# Patient Record
Sex: Female | Born: 1965 | Race: White | Hispanic: No | Marital: Married | Smoking: Never smoker
Health system: Southern US, Community
[De-identification: ages and names within clinical notes are randomized; demographics above are authoritative.]

## PROBLEM LIST (undated history)

## (undated) HISTORY — PX: SPLENECTOMY: SUR1306

---

## 1999-11-25 ENCOUNTER — Encounter: Payer: Self-pay | Admitting: Emergency Medicine

## 1999-11-25 ENCOUNTER — Encounter: Payer: Self-pay | Admitting: *Deleted

## 1999-11-25 ENCOUNTER — Inpatient Hospital Stay (HOSPITAL_COMMUNITY): Admission: EM | Admit: 1999-11-25 | Discharge: 1999-11-26 | Payer: Self-pay | Admitting: Emergency Medicine

## 1999-11-25 ENCOUNTER — Encounter: Payer: Self-pay | Admitting: Orthopedic Surgery

## 2001-10-04 ENCOUNTER — Encounter (INDEPENDENT_AMBULATORY_CARE_PROVIDER_SITE_OTHER): Payer: Self-pay | Admitting: Specialist

## 2001-10-04 ENCOUNTER — Inpatient Hospital Stay (HOSPITAL_COMMUNITY): Admission: AC | Admit: 2001-10-04 | Discharge: 2001-10-11 | Payer: Self-pay

## 2001-10-04 ENCOUNTER — Encounter: Payer: Self-pay | Admitting: General Surgery

## 2001-10-04 ENCOUNTER — Encounter: Payer: Self-pay | Admitting: Emergency Medicine

## 2001-10-05 ENCOUNTER — Encounter: Payer: Self-pay | Admitting: General Surgery

## 2001-10-05 ENCOUNTER — Encounter: Payer: Self-pay | Admitting: *Deleted

## 2001-10-06 ENCOUNTER — Encounter: Payer: Self-pay | Admitting: Surgery

## 2001-10-07 ENCOUNTER — Encounter: Payer: Self-pay | Admitting: General Surgery

## 2001-10-08 ENCOUNTER — Encounter: Payer: Self-pay | Admitting: General Surgery

## 2003-04-25 ENCOUNTER — Inpatient Hospital Stay (HOSPITAL_COMMUNITY): Admission: EM | Admit: 2003-04-25 | Discharge: 2003-04-28 | Payer: Self-pay | Admitting: Emergency Medicine

## 2003-04-25 ENCOUNTER — Encounter: Payer: Self-pay | Admitting: Emergency Medicine

## 2003-04-26 ENCOUNTER — Encounter: Payer: Self-pay | Admitting: Family Medicine

## 2008-04-06 ENCOUNTER — Ambulatory Visit (HOSPITAL_COMMUNITY): Admission: EM | Admit: 2008-04-06 | Discharge: 2008-04-06 | Payer: Self-pay | Admitting: Emergency Medicine

## 2008-10-15 ENCOUNTER — Ambulatory Visit (HOSPITAL_COMMUNITY): Admission: RE | Admit: 2008-10-15 | Discharge: 2008-10-15 | Payer: Self-pay | Admitting: Family Medicine

## 2010-12-20 NOTE — Consult Note (Signed)
NAME:  [name], [name]           ACCOUNT NO.:  [id number]   MEDICAL RECORD NO.:  [id number]          PATIENT TYPE:  INP   LOCATION:  2550                         FACILITY:  [facility]   PHYSICIAN:  Artist Pais. 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  She is 42.  She has no known drug  allergies.  No current medications.  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 Dominica Severin several years back.  She is currently taking  no medications and again no recent hospitalizations or surgery.  She  does not smoke or drink.   Exam today is well-developed and well-nourished female, pleasant, alert  and oriented x3.  Examination of her little finger shows a near  amputation with an oblique laceration that starts dorsally to eponychial  fold and volarly to the level of the DIP flexion crease.  She has an  intact nail plate.  There appears to be a nailbed laceration and open  fracture, distal phalanx.  No wrist and hand contamination.  X-rays show  fracture of the proximal third distal phalanx.   A 45 year old female with an open injury to the distal phalangeal area  of her dominant left little finger.  At this point in time, we will take  her to the operating room for an I and D and operative fixation of his  open distal phalangeal fracture.      Artist Pais Mina Marble, M.D.  Electronically Signed     MAW/MEDQ  D:  04/06/2008  T:  04/07/2008  Job:  [id number]

## 2010-12-20 NOTE — Op Note (Signed)
NAME:  [name], [name]           ACCOUNT NO.:  [id number]   MEDICAL RECORD NO.:  [id number]          PATIENT TYPE:  INP   LOCATION:  2550                         FACILITY:  [facility]   PHYSICIAN:  Artist Pais. Weingold, M.D.DATE OF BIRTH:  [birth year]   DATE OF PROCEDURE:  04/06/2008  DATE OF DISCHARGE:                               OPERATIVE REPORT   PREOPERATIVE DIAGNOSIS:  Displaced open fracture, distal phalanx, left  fifth digit.   POSTOPERATIVE DIAGNOSIS:  Displaced open fracture, distal phalanx, left  fifth digit.   PROCEDURE:  Irrigation and debridement above with open reduction and  internal fixation with single 0.035 K-wire and nail bed repair.   SURGEON:  Artist Pais. Mina Marble, MD   ASSISTANT:  None.   ANESTHESIA:  General.   TOURNIQUET TIME:  39 minutes.   COMPLICATIONS:  None.   DRAINS:  None.   OPERATIVE REPORT:  The patient was taken to the operating suite.  After  induction of adequate general anesthesia, left upper extremity was  prepped and draped in a sterile fashion.  An Esmarch was used to  exsanguinate limb.  Tourniquet was then inflated to 250 mmHg.  At this  point in time, the open fracture was debrided.  The germinal matrix had  been avulsed underneath the eponychial fold.  It was carefully placed  below the eponychial fold and was repaired with 4-0 nylon.  The wound  was opened.  The fracture was debrided off clot and some nonviable  material ; closed reduction was performed.  This was followed by  placement of single 0.035 K-wire distal to proximal across the fracture  site and across the distal interphalangeal joint.  Intraoperative  fluoroscopy revealed adequate reduction on both the AP and lateral view.  The K-wire was cut outside the skin bent upon itself and this was  followed by skin closure with 4-0 nylon.  Xeroform, 4x4s, and a volar  splint were applied as well as Coban wrap.  The patient tolerated the  procedure well and went to recovery  room in stable fashion.      Artist Pais Mina Marble, M.D.  Electronically Signed     MAW/MEDQ  D:  04/06/2008  T:  04/07/2008  Job:  [id number]
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
## 2010-12-23 NOTE — H&P (Signed)
[place]. [facility]  Patient:    [name], [name]                 MRN: [id number] Adm. Date:  11/25/99 Attending:  Elisha Ponder, M.D. Dictator:   Druscilla Brownie. Shela Nevin, P.A.                         History and Physical  CHIEF COMPLAINT:  "Pain in my long finger of my left hand."  HISTORY OF PRESENT ILLNESS:  This 45 year old white female who was at her place of employment at the [employer] in [place], [place], was feeding material into a grommet machine in order to affix handles to the material and then to be applied to the mattress.  Apparently the guard that is supposed to be engaged at the time of the grommet application was not working.  As she was feeding the  grommets into the compression machine her left long finger was smashed by the machine itself.  She had a small previous scratch while at work to her ring finger and had applied a Band-Aid early in the day.  She had immediate pain into the long finger.  Was brought to [facility] Emergency Room for evaluation.  Examination:  The patient is at bedside.  She has a open laceration at the volar surface of the DIP area of the left long finger.  Sensation is intact to the fingertip.  She has good capillary refill to the fingertip and she can flex it t the DIP joint.  X-rays taken show a fracture of the distal end of the middle phalanx at the DIP joint.  After discussion with Dr. Amanda Pea, it was felt that this patient would need debridement, as well as I&D and pinning of the fracture.  The patient is admitted for that procedure, observation, IV antibiotic therapy.  The patient received tetanus toxoid in the emergency room and a gm of cefazolin.  She is accompanied by her husband today.  PAST MEDICAL HISTORY:  This lady has really been in good health throughout her entire lifetime.  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   She has had tubal ligation.  She denies any medical illnesses.  MEDICATIONS:  Takes no medicines at the present time.  ALLERGIES:  Has no medical allergies.  The last time she had anything to eat was at 5:30 this morning.  SOCIAL HISTORY:  She neither smokes nor drinks.  Is married.  FAMILY HISTORY:  Noncontributory.  REVIEW OF SYSTEMS:  CNS:  No seizure disorder, paralysis, numbness, or double vision.  Respiratory:  No productive cough.  No hemoptysis.  No shortness of breath.  Cardiovascular:  No chest pain.  No angina.  No orthopnea. Gastrointestinal:  No nausea, vomiting, melena, bloody stool.  Genitourinary: o discharge, dysuria, or hematuria.  Musculoskeletal:  Primarily in present illness with her left long finger.  PHYSICAL EXAMINATION:  GENERAL:  Alert, cooperative, friendly, 45 year old, white female who is accompanied by her husband.  VITAL SIGNS:  Vital signs are on emergency room record.  HEENT:  Normocephalic.  PERRLA.  Oropharynx is clear.  CHEST:  Clear to auscultation.  No rhonchi.  No rales.  HEART:  Regular rate and rhythm.  No murmurs are heard.  ABDOMEN:  Soft and nontender.  GENITALIA, RECTAL, PELVIC, BREASTS:  Not done.  Not pertinent to present illness.  EXTREMITIES:  Left long finger as in present illness above.  ADMITTING DIAGNOSIS:  Open fracture of distal middle phalanx, left long finger t the DIP joint.  PLAN:  The patient will be admitted for I&D, debridement, pinning of the fracture and repair of laceration and probable extensor tendon in the left long finger.  Of note, the patient had an old fracture of the tuft of the left small finger. It was not injured during this present illness. DD:  11/25/99 TD:  11/25/99 Job: [id number]

## 2010-12-23 NOTE — Discharge Summary (Signed)
   NAME:  [name], [name]                     ACCOUNT NO.:  [id number]   MEDICAL RECORD NO.:  [id number]                   PATIENT TYPE:  INP   LOCATION:  A326                                 FACILITY:  [facility]   PHYSICIAN:  Kirk Ruths, M.D.            DATE OF BIRTH:  1965/12/05   DATE OF ADMISSION:  04/25/2003  DATE OF DISCHARGE:  04/28/2003                                 DISCHARGE SUMMARY   DISCHARGE DIAGNOSIS:  Acute pyelonephritis.   HOSPITAL COURSE:  This is a 45 year old white female who is admitted through  the emergency room with left flank pain with a temperature of 103 with many  white cells in her urine.  The patient was begun on IV rocephin.  Cultures  were obtained.  Blood cultures were negative.  Urine culture grew out 50,000  colonies of E. coli.  The patient's initial white count was 21,000 on  admission with many white cells in her urine.  Electrolytes were normal,  except sodium was slightly low at 133.  BUN and creatinine were 9 and 0.6.  Pregnancy test was negative.   The patient underwent a CT scan of the abdomen to rule out abscess; and CT  showed no evidence of abscess or hydronephrosis.  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 She is stable,  afebrile for 24 hours and will be discharged home on p.o. Keflex to be  followed in the office in 1 week.  She is stable at the time of discharge.     ___________________________________________                                         Kirk Ruths, M.D.   WMM/MEDQ  D:  04/28/2003  T:  04/28/2003  Job:  [id number]

## 2010-12-23 NOTE — Op Note (Signed)
[place]. [facility]  Patient:    [name], [name]                    MRN: [id number] Proc. Date: 11/25/99 Attending:  Elisha Ponder, M.D.                           Operative Report  DATE OF BIRTH:  07-29-1966.  PREOPERATIVE DIAGNOSIS:  Open left middle finger P-2 fracture with extensive tendon lacerations and dirty contamination with near amputation about the finger and circumferential lacerations about the finger.  POSTOPERATIVE DIAGNOSIS:  Open left middle finger P-2 fracture with extensive tendon lacerations and dirty contamination with near amputation about the finger and circumferential lacerations about the finger.  PROCEDURE: 1. Irrigation and debridement, left middle finger, to include skin,    subcutaneous tissue, and bone, as well as tendon and structures. 2. Explore ______ noted to be intact with volar plate laceration. 3. Open reduction internal fixation, left middle finger, P-2 (middle phalanx)    fracture. 4. Extensive tendon repair, left middle finger at the P-2 level. 5. Volar skin laceration repair.  SURGEON:  Elisha Ponder, M.D.  ASSISTANT:  Alexzandrew L. Perkins, P.A.-C.  COMPLICATIONS:  None.  ANESTHESIA:  General.  TOURNIQUET TIME:  Zero.  SPECIMENS:  None.  ESTIMATED BLOOD LOSS:  Less than 50 cc.  INDICATIONS FOR PROCEDURE:  Ms. Rambeau is a 45 year old white female who sustained a severe crushing injury to her left middle finger while at work. The patient works for [employer].  She was handfeeding materials into a grommet machine when the garment device caught on her left middle finger.  The patient was transferred to the hospital and noted to have the above-mentioned injuries.  Specifically, she has an open PIP joint with fracture at the distal edge of the middle phalanx.  This is open.  She has an extensive tendon laceration.  She does have refill to the tip of the finger and some pinprick sensation to  a 25-gauge needle.  This is rather surprising given the circumferential laceration about the finger and severe crush.  The patient does have comminution with bony fragments noted.  She is in a great deal of pain, and her flexor competency is difficult to evaluate.  She certainly has a near amputation.  I have discussed the risks and benefits of surgery.  She was seen by myself and my physicians assistant, Cherly Beach, in regards to this.  The patient was counseled for surgery and its risks and benefits including risk of infection, bleeding, anesthesia, damage to normal structures, ______ infection.  I discussed with her stiffness, risk for infection, specifically osteomyelitis, malunion, nonunion, delayed union, and need for PIP fusion in the future.  With all this in mind, she decided to proceed.  DESCRIPTION OF PROCEDURE:  The patient was seen in preoperative holding and counseled.  Following this, she was taken to the operating suite and underwent a smooth induction of general anesthesia and was appropriately padded in the supine position.  Next, she was prepped and draped in the usual sterile fashion.  Care was taken during the prep to avoid injury to the near-amputated fingertip about the left middle finger.  Once a prep and drape had been accomplished, the patient then underwent efforts at I&D of the finger.  The finger underwent an excisional debridement including the skin, subcutaneous tissue, and bone.  The patient had comminuted bony  fragments.  They were removed sharply with a knife blade under ______ magnification.  The dorsal extensor tendon was lacerated.  There was soft tissue damage with dirty contamination.  I did use a curette to remove any dirty debris.  All areas were I&Dd including the circumferential nature of the laceration.  Copious amounts of warm irrigant were placed.  The tourniquet was not inflated.  The patient maintained capillary refill to the tip of her  finger.  In essence, the fingertip could be shotgunned open from a dorsal approach to allow visualization of the open PIP joint with significant comminution about the fracture fragments.  The extensor tendon was noted as well.  The flexor tendon at this time was identified and noted to be intact.  The volar plate was lacerated.  With this noted and the I&D completed, the patient then had drapes changed and following this underwent ORIF of the finger.  This was done by shotgunning the finger open, once again being very careful to avoid injury to the volar structures which demonstrated intact vascular integrity.  The patient then had a 0.035 K wire placed from the PIP joint region distally to exit at the tip of the finger.  This harnessed the P-2 fracture.  The P-II fracture was harnessed with a "Y".  The "Y" was placed across the PIP joint in a reduced position about the fragment, and the "Y" was then allowed to exit distally.  It was then inset into the fracture site at P-2, and reduction was held while assistant drove the K wire across the fracture site.  This was done under radiographic image.  Two K wires were used and had excellent fixation. I was very pleased with the overall reduction.  Even despite the significant comminution, the patient maintained a fairly reasonable reduction, in my opinion.  The pins engaged nicely and were bent upon themselves ______ . Following this, more irrigation was applied.  Capillary refill was noted to be excellent, and the patient then underwent repair of the extensor tendon with Mersilene suture of the 4-0 variety without difficulty.  The extensor tendon approximated nicely.  Following this, more irrigation was applied followed by closure of the wound very loosely with interrupted Prolene and chromic suture, taking care not to overly tighten the skin edge to allow for any egress of fluid.  Excellent refill was noted at this time.  Adaptic was placed,  followed by fluffed gauze, Kerlix Webril, and finger splint as well as a volar plaster splint.  The patient was awakened from general anesthesia after this and  transferred to the recovery room in stable condition.  All sponge, needle, and instrument counts were reported as correct.  Excellent refill was noted in the recovery room.  The patient was stable, and we monitored closely.  She will be on IV antibiotics, IV fluids.  She will receive a course of Dextran and be monitored closely.  I have discussed with the family all issues.  It was a pleasure to participate in her care, and I look forward to participating in her postoperative recovery. DD:  11/25/99 TD:  11/27/99 Job: [id number]
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
## 2018-03-18 ENCOUNTER — Ambulatory Visit: Payer: Self-pay | Admitting: Physician Assistant

## 2018-04-04 ENCOUNTER — Other Ambulatory Visit: Payer: Self-pay

## 2018-04-04 ENCOUNTER — Encounter: Payer: Self-pay | Admitting: Physician Assistant

## 2018-04-04 ENCOUNTER — Ambulatory Visit (INDEPENDENT_AMBULATORY_CARE_PROVIDER_SITE_OTHER): Payer: PRIVATE HEALTH INSURANCE | Admitting: Physician Assistant

## 2018-04-04 VITALS — BP 110/86 | HR 58 | Temp 97.8°F | Resp 14 | Ht 65.0 in | Wt 118.0 lb

## 2018-04-04 DIAGNOSIS — J988 Other specified respiratory disorders: Secondary | ICD-10-CM | POA: Diagnosis not present

## 2018-04-04 DIAGNOSIS — B9689 Other specified bacterial agents as the cause of diseases classified elsewhere: Secondary | ICD-10-CM

## 2018-04-04 DIAGNOSIS — R05 Cough: Secondary | ICD-10-CM

## 2018-04-04 DIAGNOSIS — Z8781 Personal history of (healed) traumatic fracture: Secondary | ICD-10-CM

## 2018-04-04 DIAGNOSIS — Z8709 Personal history of other diseases of the respiratory system: Secondary | ICD-10-CM | POA: Diagnosis not present

## 2018-04-04 DIAGNOSIS — R0602 Shortness of breath: Secondary | ICD-10-CM | POA: Diagnosis not present

## 2018-04-04 DIAGNOSIS — Z0000 Encounter for general adult medical examination without abnormal findings: Secondary | ICD-10-CM

## 2018-04-04 DIAGNOSIS — R059 Cough, unspecified: Secondary | ICD-10-CM | POA: Insufficient documentation

## 2018-04-04 DIAGNOSIS — Z9081 Acquired absence of spleen: Secondary | ICD-10-CM

## 2018-04-04 DIAGNOSIS — Z87828 Personal history of other (healed) physical injury and trauma: Secondary | ICD-10-CM

## 2018-04-04 MED ORDER — AZITHROMYCIN 250 MG PO TABS: ORAL_TABLET | ORAL | 0 refills | Status: DC
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
## 2018-04-11 ENCOUNTER — Other Ambulatory Visit: Payer: Self-pay | Admitting: Physician Assistant

## 2018-04-11 ENCOUNTER — Ambulatory Visit (INDEPENDENT_AMBULATORY_CARE_PROVIDER_SITE_OTHER): Payer: PRIVATE HEALTH INSURANCE | Admitting: Physician Assistant

## 2018-04-11 ENCOUNTER — Encounter: Payer: Self-pay | Admitting: Physician Assistant

## 2018-04-11 ENCOUNTER — Ambulatory Visit
Admission: RE | Admit: 2018-04-11 | Discharge: 2018-04-11 | Disposition: A | Discharge status: Home / Self Care | Payer: PRIVATE HEALTH INSURANCE | Source: Ambulatory Visit | Attending: Physician Assistant | Admitting: Physician Assistant

## 2018-04-11 VITALS — BP 118/82 | HR 55 | Temp 97.8°F | Resp 16 | Ht 65.0 in | Wt 118.0 lb

## 2018-04-11 DIAGNOSIS — Z9081 Acquired absence of spleen: Secondary | ICD-10-CM

## 2018-04-11 DIAGNOSIS — R05 Cough: Secondary | ICD-10-CM

## 2018-04-11 DIAGNOSIS — Z0000 Encounter for general adult medical examination without abnormal findings: Secondary | ICD-10-CM | POA: Diagnosis not present

## 2018-04-11 DIAGNOSIS — R0602 Shortness of breath: Secondary | ICD-10-CM

## 2018-04-11 DIAGNOSIS — R059 Cough, unspecified: Secondary | ICD-10-CM

## 2018-04-11 DIAGNOSIS — Z1231 Encounter for screening mammogram for malignant neoplasm of breast: Secondary | ICD-10-CM

## 2018-04-11 LAB — COMPLETE METABOLIC PANEL WITH GFR
AG Ratio: 1.7 (calc) (ref 1.0–2.5)
ALBUMIN MSPROF: 4.8 g/dL (ref 3.6–5.1)
ALT: 11 U/L (ref 6–29)
AST: 17 U/L (ref 10–35)
Alkaline phosphatase (APISO): 61 U/L (ref 33–130)
BILIRUBIN TOTAL: 0.6 mg/dL (ref 0.2–1.2)
BUN: 16 mg/dL (ref 7–25)
CALCIUM: 9.9 mg/dL (ref 8.6–10.4)
CHLORIDE: 103 mmol/L (ref 98–110)
CO2: 28 mmol/L (ref 20–32)
Creat: 0.8 mg/dL (ref 0.50–1.05)
GFR, EST AFRICAN AMERICAN: 97 mL/min/{1.73_m2} (ref >=60)
GFR, EST NON AFRICAN AMERICAN: 84 mL/min/{1.73_m2} (ref >=60)
GLUCOSE: 87 mg/dL (ref 65–99)
Globulin: 2.8 g/dL (calc) (ref 1.9–3.7)
Potassium: 4.5 mmol/L (ref 3.5–5.3)
Sodium: 139 mmol/L (ref 135–146)
TOTAL PROTEIN: 7.6 g/dL (ref 6.1–8.1)

## 2018-04-11 LAB — CBC WITH DIFFERENTIAL/PLATELET
BASOS PCT: 0.7 %
Basophils Absolute: 49 cells/uL (ref 0–200)
EOS ABS: 91 {cells}/uL (ref 15–500)
EOS PCT: 1.3 %
HCT: 42.8 % (ref 35.0–45.0)
HEMOGLOBIN: 14.5 g/dL (ref 11.7–15.5)
Lymphs Abs: 3542 cells/uL (ref 850–3900)
MCH: 31.5 pg (ref 27.0–33.0)
MCHC: 33.9 g/dL (ref 32.0–36.0)
MCV: 93 fL (ref 80.0–100.0)
MONOS PCT: 14.7 %
MPV: 10.8 fL (ref 7.5–12.5)
NEUTROS ABS: 2289 {cells}/uL (ref 1500–7800)
Neutrophils Relative %: 32.7 %
PLATELETS: 363 10*3/uL (ref 140–400)
RBC: 4.6 10*6/uL (ref 3.80–5.10)
RDW: 12.5 % (ref 11.0–15.0)
TOTAL LYMPHOCYTE: 50.6 %
WBC mixed population: 1029 cells/uL — ABNORMAL HIGH (ref 200–950)
WBC: 7 10*3/uL (ref 3.8–10.8)

## 2018-04-11 LAB — LIPID PANEL
Cholesterol: 192 mg/dL (ref <200)
HDL: 64 mg/dL (ref >50)
LDL CHOLESTEROL (CALC): 112 mg/dL — AB
Non-HDL Cholesterol (Calc): 128 mg/dL (calc) (ref <130)
Total CHOL/HDL Ratio: 3 (calc) (ref <5.0)
Triglycerides: 71 mg/dL (ref <150)

## 2018-04-11 LAB — TSH: TSH: 2.57 m[IU]/L
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
## 2018-04-12 ENCOUNTER — Other Ambulatory Visit: Payer: Self-pay

## 2018-04-12 LAB — PAP, TP IMAGING W/ HPV RNA, RFLX HPV TYPE 16,18/45: HPV DNA HIGH RISK: NOT DETECTED

## 2018-04-12 MED ORDER — CETIRIZINE HCL 10 MG PO TABS: 10.0000 mg | ORAL_TABLET | Freq: Every day | ORAL | 5 refills | Status: DC

## 2018-04-22 ENCOUNTER — Telehealth: Payer: Self-pay | Admitting: Physician Assistant

## 2018-04-22 NOTE — Telephone Encounter (Signed)
Patient declined referral to gastroenterology for screening colonoscopy. Please see referral from 04/11/18

## 2018-05-07 ENCOUNTER — Ambulatory Visit
Admission: RE | Admit: 2018-05-07 | Discharge: 2018-05-07 | Disposition: A | Discharge status: Home / Self Care | Payer: PRIVATE HEALTH INSURANCE | Source: Ambulatory Visit | Attending: Physician Assistant | Admitting: Physician Assistant

## 2018-05-07 DIAGNOSIS — Z1231 Encounter for screening mammogram for malignant neoplasm of breast: Secondary | ICD-10-CM

## 2019-03-16 IMAGING — MG DIGITAL SCREENING BILATERAL MAMMOGRAM WITH CAD
4 series · 4 of 4 positions shown · non-contrast
Comparison: Previous exam(s).

CLINICAL DATA: Screening.

EXAM:
DIGITAL SCREENING BILATERAL MAMMOGRAM WITH CAD

[R MLO]
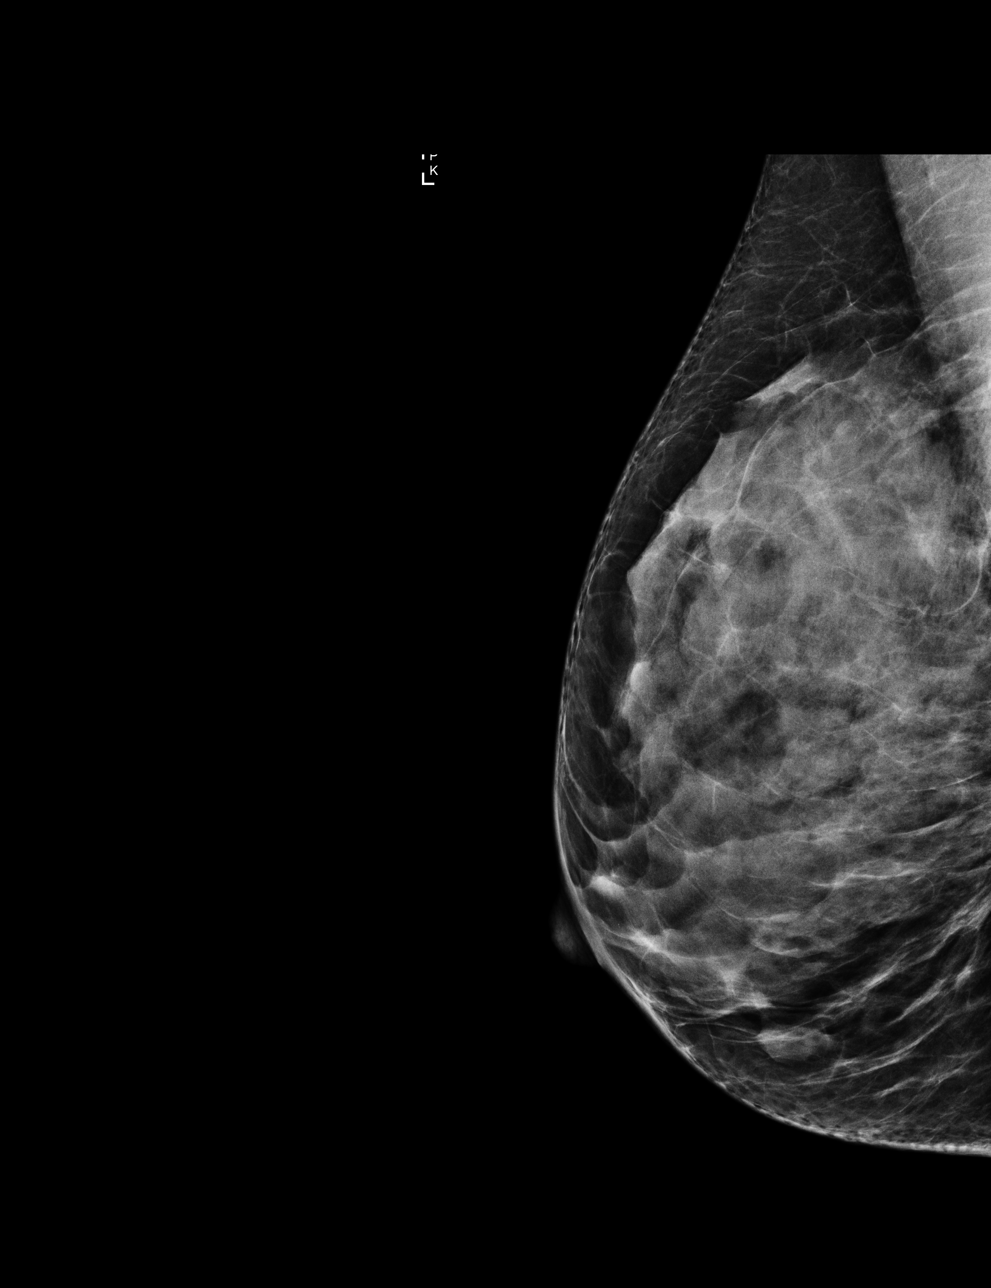

[L MLO]
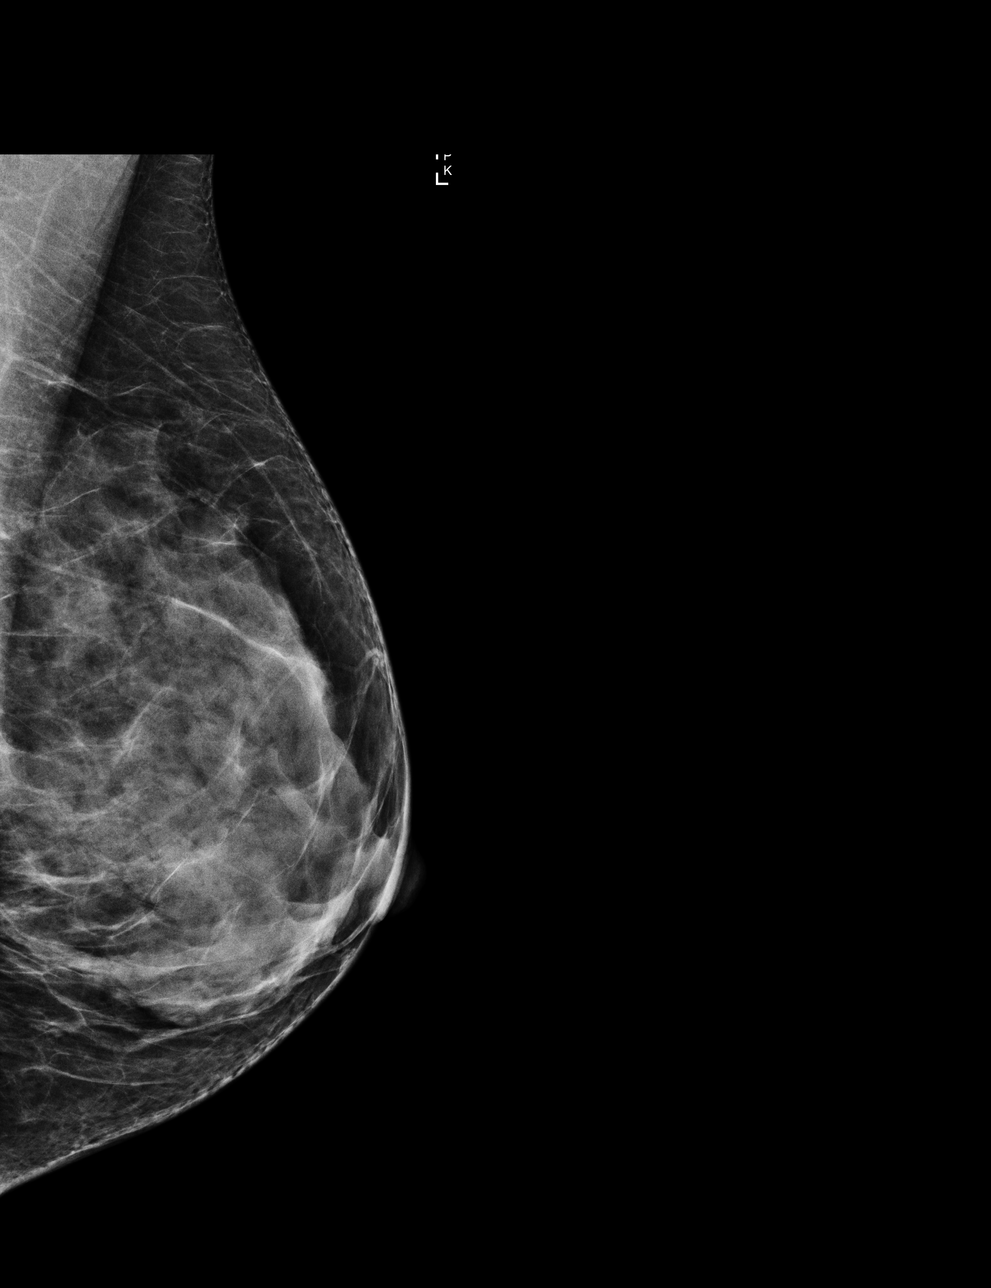

[R CC]
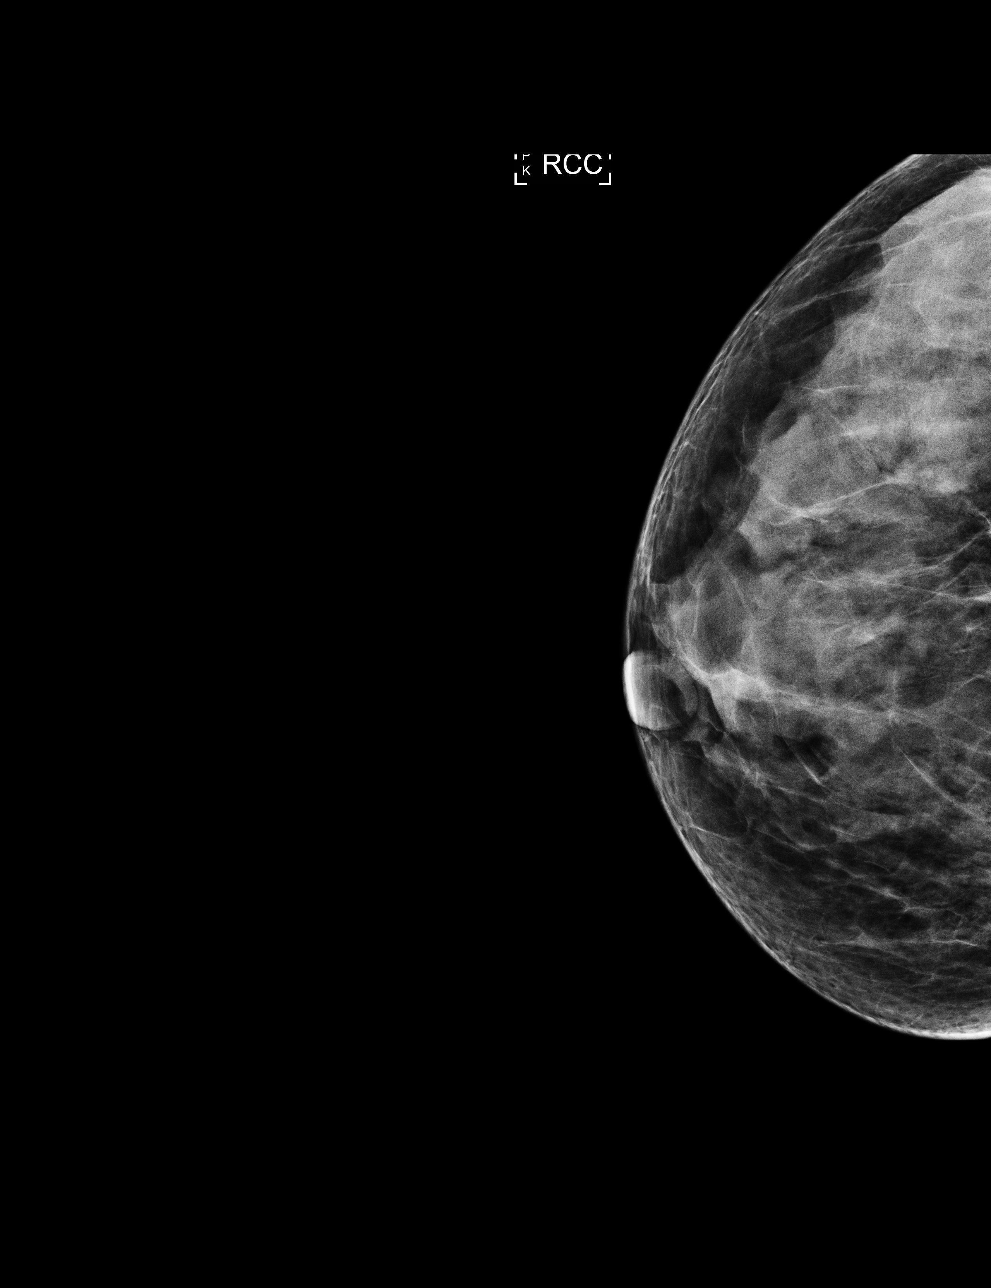

[L CC]
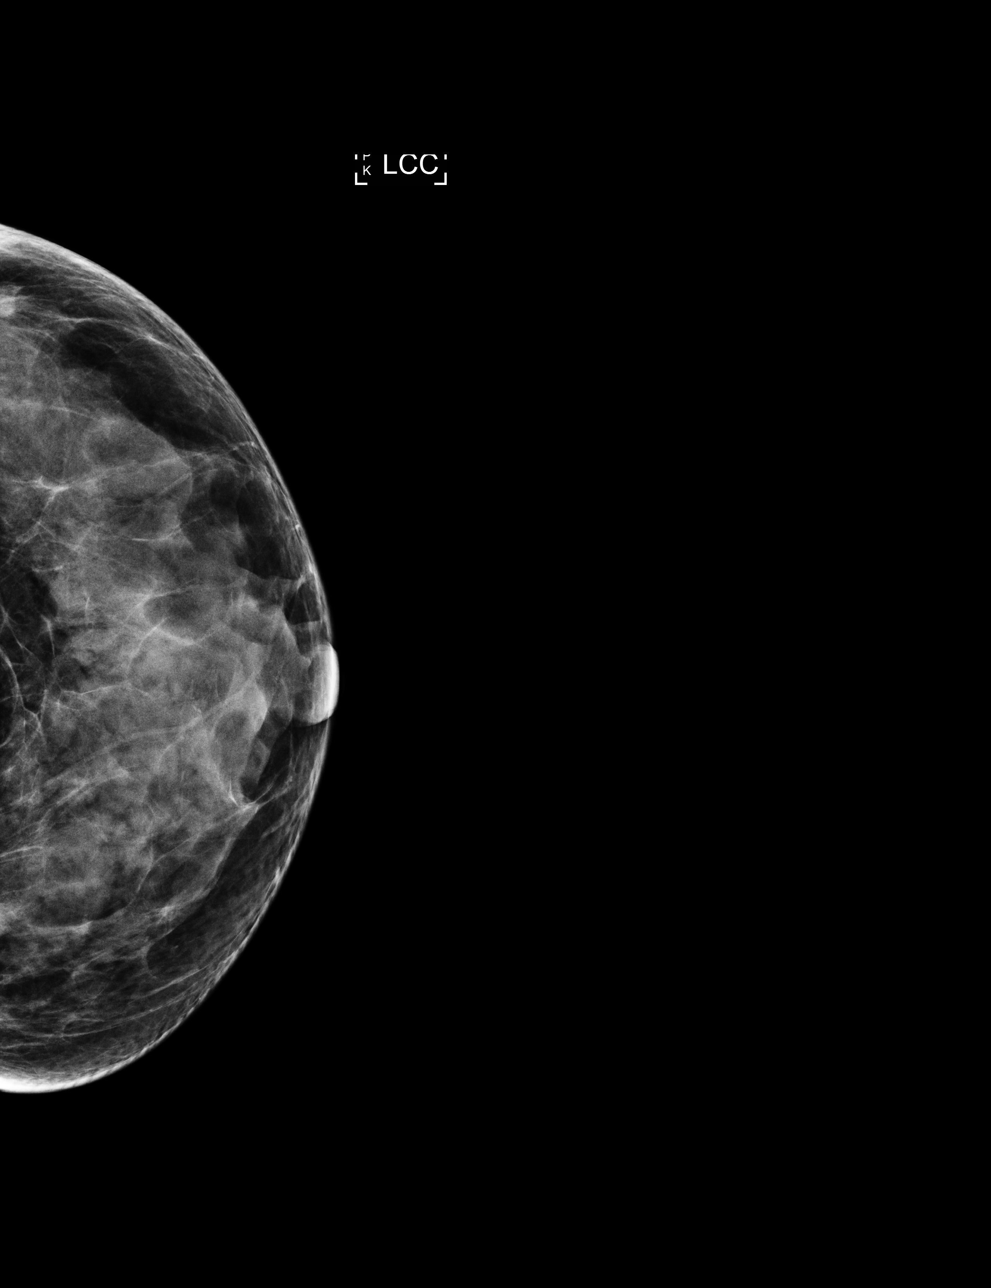

[4 of 4 positions shown; findings below may reference images not displayed]

ACR Breast Density Category d: The breast tissue is extremely dense,
which lowers the sensitivity of mammography.
FINDINGS: There are no findings suspicious for malignancy. Images were
processed with CAD.
IMPRESSION: No mammographic evidence of malignancy. A result letter of this
screening mammogram will be mailed directly to the patient.

RECOMMENDATION:
Screening mammogram in one year. (Code:BD-D-K0F)

BI-RADS CATEGORY  1: Negative.

## 2022-05-18 ENCOUNTER — Other Ambulatory Visit: Payer: Self-pay

## 2022-05-18 ENCOUNTER — Emergency Department (HOSPITAL_COMMUNITY): Payer: No Typology Code available for payment source

## 2022-05-18 ENCOUNTER — Emergency Department (HOSPITAL_COMMUNITY): Payer: PRIVATE HEALTH INSURANCE

## 2022-05-18 ENCOUNTER — Inpatient Hospital Stay (HOSPITAL_COMMUNITY)
Admission: EM | Admit: 2022-05-18 | Discharge: 2022-05-21 | DRG: 480 | Disposition: A | Discharge status: Home Health | Payer: No Typology Code available for payment source | Attending: Family Medicine | Admitting: Family Medicine

## 2022-05-18 DIAGNOSIS — R0609 Other forms of dyspnea: Secondary | ICD-10-CM | POA: Diagnosis not present

## 2022-05-18 DIAGNOSIS — Z9081 Acquired absence of spleen: Secondary | ICD-10-CM | POA: Diagnosis not present

## 2022-05-18 DIAGNOSIS — S72031A Displaced midcervical fracture of right femur, initial encounter for closed fracture: Principal | ICD-10-CM | POA: Diagnosis present

## 2022-05-18 DIAGNOSIS — S72001A Fracture of unspecified part of neck of right femur, initial encounter for closed fracture: Secondary | ICD-10-CM | POA: Diagnosis present

## 2022-05-18 DIAGNOSIS — J9601 Acute respiratory failure with hypoxia: Secondary | ICD-10-CM | POA: Diagnosis not present

## 2022-05-18 DIAGNOSIS — Z681 Body mass index (BMI) 19 or less, adult: Secondary | ICD-10-CM | POA: Diagnosis not present

## 2022-05-18 DIAGNOSIS — Z8616 Personal history of COVID-19: Secondary | ICD-10-CM | POA: Diagnosis not present

## 2022-05-18 DIAGNOSIS — W1839XA Other fall on same level, initial encounter: Secondary | ICD-10-CM | POA: Diagnosis present

## 2022-05-18 DIAGNOSIS — R1310 Dysphagia, unspecified: Secondary | ICD-10-CM | POA: Diagnosis present

## 2022-05-18 DIAGNOSIS — R918 Other nonspecific abnormal finding of lung field: Secondary | ICD-10-CM

## 2022-05-18 DIAGNOSIS — J849 Interstitial pulmonary disease, unspecified: Secondary | ICD-10-CM | POA: Diagnosis not present

## 2022-05-18 DIAGNOSIS — J9621 Acute and chronic respiratory failure with hypoxia: Secondary | ICD-10-CM

## 2022-05-18 DIAGNOSIS — R9389 Abnormal findings on diagnostic imaging of other specified body structures: Secondary | ICD-10-CM | POA: Diagnosis not present

## 2022-05-18 DIAGNOSIS — R64 Cachexia: Secondary | ICD-10-CM | POA: Diagnosis present

## 2022-05-18 DIAGNOSIS — Z20822 Contact with and (suspected) exposure to covid-19: Secondary | ICD-10-CM | POA: Diagnosis present

## 2022-05-18 LAB — CBC WITH DIFFERENTIAL/PLATELET
Abs Immature Granulocytes: 0.05 10*3/uL (ref 0.00–0.07)
Basophils Absolute: 0 10*3/uL (ref 0.0–0.1)
Basophils Relative: 0 %
Eosinophils Absolute: 0.1 10*3/uL (ref 0.0–0.5)
Eosinophils Relative: 1 %
HCT: 45 % (ref 36.0–46.0)
Hemoglobin: 15.1 g/dL — ABNORMAL HIGH (ref 12.0–15.0)
Immature Granulocytes: 0 %
Lymphocytes Relative: 18 %
Lymphs Abs: 2.3 10*3/uL (ref 0.7–4.0)
MCH: 32.7 pg (ref 26.0–34.0)
MCHC: 33.6 g/dL (ref 30.0–36.0)
MCV: 97.4 fL (ref 80.0–100.0)
Monocytes Absolute: 1 10*3/uL (ref 0.1–1.0)
Monocytes Relative: 7 %
Neutro Abs: 9.7 10*3/uL — ABNORMAL HIGH (ref 1.7–7.7)
Neutrophils Relative %: 74 %
Platelets: 320 10*3/uL (ref 150–400)
RBC: 4.62 MIL/uL (ref 3.87–5.11)
RDW: 13.2 % (ref 11.5–15.5)
WBC: 13.1 10*3/uL — ABNORMAL HIGH (ref 4.0–10.5)
nRBC: 0 % (ref 0.0–0.2)

## 2022-05-18 LAB — BASIC METABOLIC PANEL
Anion gap: 6 (ref 5–15)
BUN: 13 mg/dL (ref 6–20)
CO2: 25 mmol/L (ref 22–32)
Calcium: 9.3 mg/dL (ref 8.9–10.3)
Chloride: 107 mmol/L (ref 98–111)
Creatinine, Ser: 0.65 mg/dL (ref 0.44–1.00)
GFR, Estimated: >60 mL/min (ref >60)
Glucose, Bld: 98 mg/dL (ref 70–99)
Potassium: 4.4 mmol/L (ref 3.5–5.1)
Sodium: 138 mmol/L (ref 135–145)

## 2022-05-18 LAB — RESP PANEL BY RT-PCR (FLU A&B, COVID) ARPGX2
Influenza A by PCR: NEGATIVE
Influenza B by PCR: NEGATIVE
SARS Coronavirus 2 by RT PCR: NEGATIVE

## 2022-05-18 MED ORDER — HYDROMORPHONE HCL 1 MG/ML IJ SOLN
0.5000 mg | Freq: Once | INTRAMUSCULAR | Status: AC
  Administered 2022-05-18: 0.5 mg via INTRAVENOUS
  Filled 2022-05-18: qty 1

## 2022-05-18 MED ORDER — IOHEXOL 350 MG/ML SOLN
80.0000 mL | Freq: Once | INTRAVENOUS | Status: AC | PRN
  Administered 2022-05-18: 80 mL via INTRAVENOUS
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
## 2022-05-18 NOTE — Assessment & Plan Note (Signed)
Suspect patient may have had longstanding hypoxia based on symptoms elevated hemoglobin.  CT scan worrisome for diffuse alveolar disease.  Sent message to pulmonology to see patient in AM.  Ordered sed rate CRP AFB ANA Airborne precautions given cough weight loss night sweats Evidence of scarring versus mass.  Would need father pulmonology evaluation Patient is non-smoker never has history or COPD asthma   this patient has acute respiratory failure with Hypoxia and  as documented by the presence of following: O2 saturatio< 90% on RA  Likely due to: Underlying lung disease Provide O2 therapy and titrate as needed  Continuous pulse ox  check Pulse ox with ambulation prior to discharge  may need  TC consult for home O2 set up

## 2022-05-18 NOTE — Assessment & Plan Note (Signed)
-   management as per orthopedics,  plan to operate   in  a.m.    Keep nothing by mouth post midnight. Patient  not on anticoagulation or antiplatelet agents    Ordered type and screen order a vitamin D level  Patient at baseline able to walk a flight of stairs or 100 feet but slow and with  shortness of breath.    Patient denies any chest pain but has  shortness of breath  with exertion,   ECG showing no evidence of acute ischemia but is abnormal  no known history of coronary artery disease,  COPD   Liver failure   CKD  Given advanced age patient is at least moderate  risk   which has been discussed with family   will order echo  cycle CE    given severe Pulmonary disease would defere to pulmonology for pre-op clearance

## 2022-05-18 NOTE — Assessment & Plan Note (Signed)
Patient may be at risk for atypical infections.  Continue to monitor

## 2022-05-18 NOTE — ED Provider Notes (Signed)
[facility] EMERGENCY DEPARTMENT Provider Note   CSN: [id number] Arrival date & time: 05/18/22  1020     History  Chief Complaint  Patient presents with   Hip Pain    [name] is a 56 y.o. female with no significant history. Pt complains of  hip pain.  She states around 8:30 AM she hit a plastic bin, fell to her right on concrete.  Denies hitting her head.  Denies LOC.  She was able to get up and walk back to her house  immediately after the fall.  The pain started from her R hip and radiates to her groin.  He is now unable to lift her right leg.  Denies nausea, vomiting, chest pain, pain elsewhere.   Hip Pain       Home Medications Prior to Admission medications   Medication Sig Start Date End Date Taking? Authorizing Provider  cetirizine (ZYRTEC) 10 MG tablet Take 1 tablet (10 mg total) by mouth daily. 04/12/18   Dorena Bodo, PA-C      Allergies    Patient has no known allergies.    Review of Systems   Review of Systems  Musculoskeletal:        Hip pain     Physical Exam Updated Vital Signs BP 138/82   Pulse 74   Temp 98.1 F (36.7 C)   Resp 18   Ht 5\' 5"  (1.651 m)   Wt 43.5 kg   SpO2 92%   BMI 15.98 kg/m  Physical Exam Vitals and nursing note reviewed.  Constitutional:      Appearance: Normal appearance.  HENT:     Head: Normocephalic and atraumatic.     Mouth/Throat:     Mouth: Mucous membranes are moist.  Eyes:     General: No scleral icterus. Cardiovascular:     Rate and Rhythm: Normal rate and regular rhythm.     Pulses: Normal pulses.     Heart sounds: Normal heart sounds.  Pulmonary:     Effort: Pulmonary effort is normal.     Breath sounds: Normal breath sounds.  Abdominal:     General: Abdomen is flat.     Palpations: Abdomen is soft.     Tenderness: There is no abdominal tenderness.  Musculoskeletal:        General: No deformity.     Comments: TTP to right hip.  Skin:    General: Skin is warm.      Findings: No rash.  Neurological:     General: No focal deficit present.     Mental Status: She is alert.  Psychiatric:        Mood and Affect: Mood normal.     ED Results / Procedures / Treatments   Labs (all labs ordered are listed, but only abnormal results are displayed) Labs Reviewed  CBC WITH DIFFERENTIAL/PLATELET - Abnormal; Notable for the following components:      Result Value   WBC 13.1 (*)    Hemoglobin 15.1 (*)    Neutro Abs 9.7 (*)    All other components within normal limits  RESP PANEL BY RT-PCR (FLU A&B, COVID) ARPGX2  BASIC METABOLIC PANEL    EKG None  Radiology CT Angio Chest PE W and/or Wo Contrast  Result Date: 05/18/2022 CLINICAL DATA:  Pulmonary embolism (PE) suspected, high prob. Hypoxia EXAM: CT ANGIOGRAPHY CHEST WITH CONTRAST TECHNIQUE: Multidetector CT imaging of the chest was performed using the standard protocol during bolus administration of intravenous  contrast. Multiplanar CT image reconstructions and MIPs were obtained to evaluate the vascular anatomy. RADIATION DOSE REDUCTION: This exam was performed according to the departmental dose-optimization program which includes automated exposure control, adjustment of the mA and/or kV according to patient size and/or use of iterative reconstruction technique. CONTRAST:  36mL OMNIPAQUE IOHEXOL 350 MG/ML SOLN COMPARISON:  Chest x-ray 05/18/2022 FINDINGS: Cardiovascular: Satisfactory opacification of the pulmonary arteries to the segmental level. No evidence of pulmonary embolism. Normal heart size. No significant pericardial effusion. The thoracic aorta is normal in caliber. No atherosclerotic plaque of the thoracic aorta. No coronary artery calcifications. Mediastinum/Nodes: No enlarged mediastinal, hilar, or axillary lymph nodes. Thyroid gland, trachea, and esophagus demonstrate no significant findings. Lungs/Pleura: Mosaic attenuation of the of lungs. No focal consolidation. Scattered pulmonary nodules most  prominent within the lower lung zones with as an example a 1 cm right lower lobe pulmonary nodule (8:107) and a 0.7 cm lingular pulmonary nodule (8: 106). No pulmonary mass. No pleural effusion. No pneumothorax. Upper Abdomen: No acute abnormality. Musculoskeletal: No chest wall abnormality. No suspicious lytic or blastic osseous lesions. No acute displaced fracture. Review of the MIP images confirms the above findings. IMPRESSION: 1. No pulmonary embolus. 2. Scattered pulmonary nodules most prominent within the lower lung zones concerning for metastases. 3. Mosaic attenuation of the lungs. Findings suggestive of small airway disease. Electronically Signed   By: Tish Frederickson M.D.   On: 05/18/2022 18:54   CT HIP RIGHT WO CONTRAST  Result Date: 05/18/2022 CLINICAL DATA:  Right hip fracture, preop planning EXAM: CT OF THE RIGHT HIP WITHOUT CONTRAST TECHNIQUE: Multidetector CT imaging of the right hip was performed according to the standard protocol. Multiplanar CT image reconstructions were also generated. RADIATION DOSE REDUCTION: This exam was performed according to the departmental dose-optimization program which includes automated exposure control, adjustment of the mA and/or kV according to patient size and/or use of iterative reconstruction technique. COMPARISON:  None Available. FINDINGS: Transcervical right femoral neck fracture, impacted and essentially nondisplaced. No additional fracture is seen. Right hip joint space is preserved. Visualized soft tissues are grossly unremarkable. IMPRESSION: Transcervical right femoral neck fracture, impacted and essentially nondisplaced. Electronically Signed   By: Charline Bills M.D.   On: 05/18/2022 18:43   DG Chest 2 View  Result Date: 05/18/2022 CLINICAL DATA:  Hypoxia. Unable to remove clothing of imaging due to hip pain and fractured right hip. EXAM: CHEST - 2 VIEW COMPARISON:  Chest two views 04/11/2018 FINDINGS: Cardiac silhouette and mediastinal  contours are within normal limits. There is flattening of the diaphragms and moderate hyperinflation. There is chronic blunting of the posterior and left costophrenic angles, unchanged from 04/11/2018. This is in the region of inferolateral left rib remote fractures and may represent pleural scarring from that trauma. No focal airspace opacity is seen to indicate pneumonia. No pneumothorax. Mild multilevel degenerative disc changes of the thoracic spine. IMPRESSION: 1. Chronic blunting of the posterior and left costophrenic angles, unchanged from 04/11/2018. This may represent pleural scarring from that trauma. 2. No acute lung process. Electronically Signed   By: Neita Garnet M.D.   On: 05/18/2022 13:09   DG HIP UNILAT WITH PELVIS 2-3 VIEWS RIGHT  Result Date: 05/18/2022 CLINICAL DATA:  Right hip pain, status post fall EXAM: DG HIP (WITH OR WITHOUT PELVIS) 3V RIGHT COMPARISON:  None Available. FINDINGS: Right femoral neck fracture, mildly displaced. No additional fracture is seen. No significant soft tissue abnormality. IMPRESSION: Right femoral neck fracture, mildly displaced. Electronically Signed  By: Wiliam Ke M.D.   On: 05/18/2022 12:56    Procedures Procedures    Medications Ordered in ED Medications  HYDROmorphone (DILAUDID) injection 0.5 mg (0.5 mg Intravenous Given 05/18/22 1700)  iohexol (OMNIPAQUE) 350 MG/ML injection 80 mL (80 mLs Intravenous Contrast Given 05/18/22 1817)    ED Course/ Medical Decision Making/ A&P Clinical Course as of 05/18/22 1902  Thu May 18, 2022  1548 DG HIP UNILAT WITH PELVIS 2-3 VIEWS RIGHT [KL]    Clinical Course User Index [KL] Jeanelle Malling, PA                           Medical Decision Making Amount and/or Complexity of Data Reviewed Labs: ordered. Radiology: ordered. Decision-making details documented in ED Course.  Risk Prescription drug management. Decision regarding hospitalization.   This patient presents to the ED for concern of hip  pain, this involves an extensive number of treatment options, and is a complaint that carries with it a high risk of complications and morbidity.  The differential diagnosis includes hip fracture, dislocation, musculoskeletal pain, PE, fat embolism.   Co morbidities that complicate the patient evaluation  See HPI   Additional history obtained:  Additional history obtained from EMR External records from outside source obtained and reviewed including Care Everywhere/External Records and Primary Care Documents  Lab Tests:  I Ordered, and personally interpreted labs.  The pertinent results include:   Leukocytosis 13.1 noted.  No evidence of anemia.  Platelets within normal range.  No electrolyte abnormalities noted.  Renal function within normal limits.  No transaminitis noted.  COVID, influenza negative.  Imaging Studies ordered:  I ordered imaging studies including chest x-ray, hip x-ray, CT angio chest, CT hip I independently visualized and interpreted imaging which showed no acute lung process, right femoral neck fracture mildly displaced. I agree with the radiologist interpretation   Cardiac Monitoring: / EKG:  The patient was maintained on a cardiac monitor.  I personally viewed and interpreted the cardiac monitored which showed an underlying rhythm of: sinus rhythm   Consultations Obtained:  I requested consultation with the orthopedics Dr. Blanchie Dessert,  and discussed lab and imaging findings as well as pertinent plan - they recommend: Patient is admitted   Problem List / ED Course / Critical interventions / Medication management  Hip fracture Vitals signs within normal range and stable throughout visit. Laboratory/imaging studies significant for: See above On physical examination, patient is afebrile and appears in a lot of pain.  He has tenderness to palpation to the right hip.  Hip x-rays showed femoral neck fracture.  Chest x-ray showed no acute abnormality.  Patient was  hypoxic in triage O2 sat 84% to 86%.  She was put on 2 L of nasal cannula oxygen.  This is new to her.  She is not wearing oxygen at home.  I will obtain CT angio of the chest to rule out PE. CT angio showed no evidence of PE.  There was pulmonary nodules across the base of suggestive of possible metastases. I recommended follow up with pulmonology for further evaluation. I ordered medication including Dilaudid for pain Reevaluation of the patient after these medicines showed that the patient improved I have reviewed the patients home medicines and have made adjustments as needed Treatment plan discussed with patient.  Pt acknowledged understanding was agreeable to the plan.    Social Determinants of Health:  NA   Test / Admission / Dispo - Considered:  Patient has been seen by ortho and will be admitted to the hospital for further evaluation and management. Dr. Dimitri Ped was given report for admission.        Final Clinical Impression(s) / ED Diagnoses Final diagnoses:  None    Rx / DC Orders ED Discharge Orders     None         Rex Kras, [place] 05/19/22 [id number]    Elgie Congo, MD 05/19/22 1302

## 2022-05-18 NOTE — ED Triage Notes (Signed)
Pt. Stated, I fell this morning by hit my foot and then fell . I hurt my rt. Hip that goes into the groin area. I fell on cement. Unable to bear weight

## 2022-05-18 NOTE — H&P (Signed)
[name] WJX:[id number] DOB: [birth year] DOA: 05/18/2022     PCP: Patient, No Pcp Per   Outpatient Specialists: NONE    Patient arrived to ER on 05/18/22 at 1020 Referred by Attending Tegeler, Canary Brim, *   Patient coming from:    home Lives a  With family    Chief Complaint:   Chief Complaint  Patient presents with   Hip Pain    HPI: [name] is a 56 y.o. female with medical history significant of  splenectomy    Presented with right hip pain  Mechanical fall on right hip  Have not seen any PCP for 4 years Does not smoke was found to be hypoxic down to 88% on Ra no hx of needing O2 prior She has chronic bad cough after she eats or laying down flat She ahs been loosing weight  Very bad night sweats She works at [employer] No travel, never smoked no etoh She has sever shortness of breath at baseline For the past 1 year  She is able to walk about 1 mile but very slow Chronic producing green sputum  No pet  birds Never been to jail not a veteran no exposure to tb She had a mammogram 4 y ago Never had a colonoscopy   Initial COVID TEST  NEGATIVE   Lab Results  Component Value Date   SARSCOV2NAA NEGATIVE 05/18/2022    Regarding pertinent Chronic problems:      While in ER: Clinical Course as of 05/18/22 1944  Thu May 18, 2022  1548 DG HIP UNILAT WITH PELVIS 2-3 VIEWS RIGHT [KL]    Clinical Course User Index [KL] Jeanelle Malling, PA    Ordered Hip film Right femoral neck fracture, mildly displaced.     CXR - Chronic blunting of the posterior and left costophrenic angles, unchanged from 04/11/2018. This may represent pleural scarring from that trauma   CTA chest -   no PE, no evidence of infiltrate Scattered pulmonary nodules most prominent within the lower lung Mosaic attenuation of the lungs. Findings suggestive of small airway disease.  Ct hip Transcervical right femoral neck fracture, impacted and  essentially nondisplaced.   Following Medications were ordered in ER: Medications  HYDROmorphone (DILAUDID) injection 0.5 mg (has no administration in time range)  HYDROmorphone (DILAUDID) injection 0.5 mg (0.5 mg Intravenous Given 05/18/22 1700)  iohexol (OMNIPAQUE) 350 MG/ML injection 80 mL (80 mLs Intravenous Contrast Given 05/18/22 1817)    _______________________________________________________ ER Provider Called:  Orthopedics   Dr.Marchwrany They Recommend admit to medicine   Will see in AM   npo p MN   ED Triage Vitals  Enc Vitals Group     BP 05/18/22 1102 (!) 143/81     Pulse Rate 05/18/22 1102 72     Resp 05/18/22 1102 20     Temp 05/18/22 1102 98.5 F (36.9 C)     Temp Source 05/18/22 1102 Oral     SpO2 05/18/22 1102 (!) 88 %     Weight 05/18/22 1106 96 lb (43.5 kg)     Height 05/18/22 1106 5\' 5"  (1.651 m)     Head Circumference --      Peak Flow --      Pain Score 05/18/22 1105 4     Pain Loc --      Pain Edu? --      Excl. in GC? --   TMAX(24)@     _________________________________________ Significant initial  Findings:  Abnormal Labs Reviewed  CBC WITH DIFFERENTIAL/PLATELET - Abnormal; Notable for the following components:      Result Value   WBC 13.1 (*)    Hemoglobin 15.1 (*)    Neutro Abs 9.7 (*)    All other components within normal limits     ECG: Ordered Personally reviewed and interpreted by me showing: HR : 76 Rhythm: Sinus rhythm Abnormal T, consider ischemia, diffuse leads QTC 428   The recent clinical data is shown below. Vitals:   05/18/22 1613 05/18/22 1719 05/18/22 1821 05/18/22 1915  BP: (!) 146/79 114/72 138/82 122/75  Pulse: 76 71 74 73  Resp: 18 18 18    Temp:   98.1 F (36.7 C)   TempSrc:      SpO2: 94% 92% 92% 92%  Weight:      Height:        WBC     Component Value Date/Time   WBC 13.1 (H) 05/18/2022 1218   LYMPHSABS 2.3 05/18/2022 1218   MONOABS 1.0 05/18/2022 1218   EOSABS 0.1 05/18/2022 1218   BASOSABS 0.0  05/18/2022 1218       UA   ordered   Urine analysis: No results found for: "COLORURINE", "APPEARANCEUR", "LABSPEC", "PHURINE", "GLUCOSEU", "HGBUR", "BILIRUBINUR", "KETONESUR", "PROTEINUR", "UROBILINOGEN", "NITRITE", "LEUKOCYTESUR"  Results for orders placed or performed during the hospital encounter of 05/18/22  Resp Panel by RT-PCR (Flu A&B, Covid) Anterior Nasal Swab     Status: None   Collection Time: 05/18/22 12:16 PM   Specimen: Anterior Nasal Swab  Result Value Ref Range Status   SARS Coronavirus 2 by RT PCR NEGATIVE NEGATIVE Final         Influenza A by PCR NEGATIVE NEGATIVE Final   Influenza B by PCR NEGATIVE NEGATIVE Final          _______________________________________________ Hospitalist was called for admission for hip fracture   The following Work up has been ordered so far:  Orders Placed This Encounter  Procedures   Resp Panel by RT-PCR (Flu A&B, Covid) Anterior Nasal Swab   DG HIP UNILAT WITH PELVIS 2-3 VIEWS RIGHT   DG Chest 2 View   CT Angio Chest PE W and/or Wo Contrast   CT HIP RIGHT WO CONTRAST   CBC with Differential   Basic metabolic panel   Diet NPO time specified   Consult to orthopedic surgery   Consult for [place] Admission     OTHER Significant initial  Findings:  labs showing:  Recent Labs  Lab 05/18/22 1218  NA 138  K 4.4  CO2 25  GLUCOSE 98  BUN 13  CREATININE 0.65  CALCIUM 9.3    Cr  stable,   Lab Results  Component Value Date   CREATININE 0.65 05/18/2022   CREATININE 0.80 04/11/2018    No results for input(s): "AST", "ALT", "ALKPHOS", "BILITOT", "PROT", "ALBUMIN" in the last 168 hours. Lab Results  Component Value Date   CALCIUM 9.3 05/18/2022    Plt: Lab Results  Component Value Date   PLT 320 05/18/2022     COVID-19 Labs  No results for input(s): "DDIMER", "FERRITIN", "LDH", "CRP" in the last 72 hours.  Lab Results  Component Value Date   SARSCOV2NAA NEGATIVE 05/18/2022        Recent Labs   Lab 05/18/22 1218  WBC 13.1*  NEUTROABS 9.7*  HGB 15.1*  HCT 45.0  MCV 97.4  PLT 320    HG/HCT stable,       Component Value Date/Time  HGB 15.1 (H) 05/18/2022 1218   HCT 45.0 05/18/2022 1218   MCV 97.4 05/18/2022 1218         Cultures: No results found for: "SDES", "SPECREQUEST", "CULT", "REPTSTATUS"   Radiological Exams on Admission: CT Angio Chest PE W and/or Wo Contrast  Result Date: 05/18/2022 CLINICAL DATA:  Pulmonary embolism (PE) suspected, high prob. Hypoxia EXAM: CT ANGIOGRAPHY CHEST WITH CONTRAST TECHNIQUE: Multidetector CT imaging of the chest was performed using the standard protocol during bolus administration of intravenous contrast. Multiplanar CT image reconstructions and MIPs were obtained to evaluate the vascular anatomy. RADIATION DOSE REDUCTION: This exam was performed according to the departmental dose-optimization program which includes automated exposure control, adjustment of the mA and/or kV according to patient size and/or use of iterative reconstruction technique. CONTRAST:  9mL OMNIPAQUE IOHEXOL 350 MG/ML SOLN COMPARISON:  Chest x-ray 05/18/2022 FINDINGS: Cardiovascular: Satisfactory opacification of the pulmonary arteries to the segmental level. No evidence of pulmonary embolism. Normal heart size. No significant pericardial effusion. The thoracic aorta is normal in caliber. No atherosclerotic plaque of the thoracic aorta. No coronary artery calcifications. Mediastinum/Nodes: No enlarged mediastinal, hilar, or axillary lymph nodes. Thyroid gland, trachea, and esophagus demonstrate no significant findings. Lungs/Pleura: Mosaic attenuation of the of lungs. No focal consolidation. Scattered pulmonary nodules most prominent within the lower lung zones with as an example a 1 cm right lower lobe pulmonary nodule (8:107) and a 0.7 cm lingular pulmonary nodule (8: 106). No pulmonary mass. No pleural effusion. No pneumothorax. Upper Abdomen: No acute abnormality.  Musculoskeletal: No chest wall abnormality. No suspicious lytic or blastic osseous lesions. No acute displaced fracture. Review of the MIP images confirms the above findings. IMPRESSION: 1. No pulmonary embolus. 2. Scattered pulmonary nodules most prominent within the lower lung zones concerning for metastases. 3. Mosaic attenuation of the lungs. Findings suggestive of small airway disease. Electronically Signed   By: Iven Finn M.D.   On: 05/18/2022 18:54   CT HIP RIGHT WO CONTRAST  Result Date: 05/18/2022 CLINICAL DATA:  Right hip fracture, preop planning EXAM: CT OF THE RIGHT HIP WITHOUT CONTRAST TECHNIQUE: Multidetector CT imaging of the right hip was performed according to the standard protocol. Multiplanar CT image reconstructions were also generated. RADIATION DOSE REDUCTION: This exam was performed according to the departmental dose-optimization program which includes automated exposure control, adjustment of the mA and/or kV according to patient size and/or use of iterative reconstruction technique. COMPARISON:  None Available. FINDINGS: Transcervical right femoral neck fracture, impacted and essentially nondisplaced. No additional fracture is seen. Right hip joint space is preserved. Visualized soft tissues are grossly unremarkable. IMPRESSION: Transcervical right femoral neck fracture, impacted and essentially nondisplaced. Electronically Signed   By: Julian Hy M.D.   On: 05/18/2022 18:43   DG Chest 2 View  Result Date: 05/18/2022 CLINICAL DATA:  Hypoxia. Unable to remove clothing of imaging due to hip pain and fractured right hip. EXAM: CHEST - 2 VIEW COMPARISON:  Chest two views 04/11/2018 FINDINGS: Cardiac silhouette and mediastinal contours are within normal limits. There is flattening of the diaphragms and moderate hyperinflation. There is chronic blunting of the posterior and left costophrenic angles, unchanged from 04/11/2018. This is in the region of inferolateral left rib  remote fractures and may represent pleural scarring from that trauma. No focal airspace opacity is seen to indicate pneumonia. No pneumothorax. Mild multilevel degenerative disc changes of the thoracic spine. IMPRESSION: 1. Chronic blunting of the posterior and left costophrenic angles, unchanged from 04/11/2018. This may represent  pleural scarring from that trauma. 2. No acute lung process. Electronically Signed   By: Yvonne Kendall M.D.   On: 05/18/2022 13:09   DG HIP UNILAT WITH PELVIS 2-3 VIEWS RIGHT  Result Date: 05/18/2022 CLINICAL DATA:  Right hip pain, status post fall EXAM: DG HIP (WITH OR WITHOUT PELVIS) 3V RIGHT COMPARISON:  None Available. FINDINGS: Right femoral neck fracture, mildly displaced. No additional fracture is seen. No significant soft tissue abnormality. IMPRESSION: Right femoral neck fracture, mildly displaced. Electronically Signed   By: Merilyn Baba M.D.   On: 05/18/2022 12:56   _______________________________________________________________________________________________________ Latest  Blood pressure 122/75, pulse 73, temperature 98.1 F (36.7 C), resp. rate 18, height 5\' 5"  (1.651 m), weight 43.5 kg, SpO2 92 %.   Vitals  labs and radiology finding personally reviewed  Review of Systems:    Pertinent night sweats,positives include:  chills, fatigue, weight loss  shortness of breath at rest.  dyspnea on exertion excess mucus,  productive cough,  Constitutional:  No weight loss,  Fevers,  HEENT:  No headaches, Difficulty swallowing,Tooth/dental problems,Sore throat,  No sneezing, itching, ear ache, nasal congestion, post nasal drip,  Cardio-vascular:  No chest pain, Orthopnea, PND, anasarca, dizziness, palpitations.no Bilateral lower extremity swelling  GI:  No heartburn, indigestion, abdominal pain, nausea, vomiting, diarrhea, change in bowel habits, loss of appetite, melena, blood in stool, hematemesis Resp:  no , No eNo non-productive cough, No coughing up of  blood.No change in color of mucus.No wheezing. Skin:  no rash or lesions. No jaundice GU:  no dysuria, change in color of urine, no urgency or frequency. No straining to urinate.  No flank pain.  Musculoskeletal:  No joint pain or no joint swelling. No decreased range of motion. No back pain.  Psych:  No change in mood or affect. No depression or anxiety. No memory loss.  Neuro: no localizing neurological complaints, no tingling, no weakness, no double vision, no gait abnormality, no slurred speech, no confusion  All systems reviewed and apart from [place] all are negative _______________________________________________________________________________________________ Past Medical History:  No past medical history on file.    No past surgical history on file.  Social History:  Ambulatory   independently       reports that she has never smoked. She has never used smokeless tobacco. She reports that she does not currently use drugs. She reports that she does not drink alcohol.     Family History: No family hx of CAD or lung disease ______________________________________________________________________________________________ Allergies: No Known Allergies   Prior to Admission medications   Medication Sig Start Date End Date Taking? Authorizing Provider  cetirizine (ZYRTEC) 10 MG tablet Take 1 tablet (10 mg total) by mouth daily. 04/12/18   Orlena Sheldon, PA-C    ___________________________________________________________________________________________________ Physical Exam:    05/18/2022    7:15 PM 05/18/2022    6:21 PM 05/18/2022    5:19 PM  Vitals with BMI  Systolic [id number]  Diastolic 75 82 72  Pulse 73 74 71     1. General:  in No  Acute distress   Chronically ill  -appearing 2. Psychological: Alert and   Oriented 3. Head/ENT:    Dry Mucous Membranes                          Head Non traumatic, neck supple  Poor Dentition 4. SKIN:  decreased Skin turgor,  Skin clean Dry and intact no rash 5. Heart: Regular rate and rhythm no  Murmur, no Rub or gallop 6. Lungs:   no wheezes or crackles   7. Abdomen: Soft,  non-tender, Non distended  bowel sounds present 8. Lower extremities: no clubbing, cyanosis, no  edema 9. Neurologically Grossly intact, moving all 4 extremities equally   10. MSK: Normal range of motion    Chart has been reviewed  ______________________________________________________________________________________________  Assessment/Plan 56 y.o. female with medical history significant of  splenectomy  Admitted for right hip fracture   Present on Admission:  Closed right hip fracture ([place])  Acute respiratory failure with hypoxia ([place])     Closed right hip fracture ([place])  - management as per orthopedics,  plan to operate   in  a.m.    Keep nothing by mouth post midnight. Patient  not on anticoagulation or antiplatelet agents    Ordered type and screen order a vitamin D level  Patient at baseline able to walk a flight of stairs or 100 feet but slow and with  shortness of breath.    Patient denies any chest pain but has  shortness of breath  with exertion,   ECG showing no evidence of acute ischemia but is abnormal  no known history of coronary artery disease,  COPD   Liver failure   CKD  Given advanced age patient is at least moderate  risk   which has been discussed with family   will order echo  cycle CE    given severe Pulmonary disease would defere to pulmonology for pre-op clearance       History of splenectomy Patient may be at risk for atypical infections.  Continue to monitor  Acute respiratory failure with hypoxia ([place]) Suspect patient may have had longstanding hypoxia based on symptoms elevated hemoglobin.  CT scan worrisome for diffuse alveolar disease.  Sent message to pulmonology to see patient in AM.  Ordered sed rate CRP AFB ANA Airborne precautions given cough weight loss night  sweats Evidence of scarring versus mass.  Would need father pulmonology evaluation Patient is non-smoker never has history or COPD asthma   this patient has acute respiratory failure with Hypoxia and  as documented by the presence of following: O2 saturatio< 90% on RA  Likely due to: Underlying lung disease Provide O2 therapy and titrate as needed  Continuous pulse ox  check Pulse ox with ambulation prior to discharge  may need  TC consult for home O2 set up     Other plan as per orders.  DVT prophylaxis:  SCD       Code Status:    Code Status: Not on file FULL CODE  as per patient   I had personally discussed CODE STATUS with patient and family     Family Communication:   Family at  Bedside  plan of care was discussed   with   Husband, Sister,    Disposition Plan:      To home once workup is complete and patient is stable   Following barriers for discharge:                                                     Will need consultants to evaluate patient prior to discharge  Would benefit from PT/OT eval prior to DC  Ordered                   Swallow eval - SLP ordered                                     Nutrition    consulted                                   Consults called:    ortphopedics and need to discuss with pulmonary  Admission status:  ED Disposition     ED Disposition  [place]: [place] [[id number]]  Level of Care: Telemetry Medical [104]  May admit patient to [facility] or [facility] if equivalent level of care is available:: No  Covid Evaluation: Asymptomatic - no recent exposure (last 10 days) testing not required  Diagnosis: Closed right hip fracture [facility]) [[id number]]  Admitting Physician: Toy Baker [[id number]]  Attending Physician: Toy Baker [[id number]]  Certification:: I certify this patient will need inpatient services for at least 2 midnights  Estimated Length of  Stay: 2            inpatient     I Expect 2 midnight stay secondary to severity of patient's current illness need for inpatient interventions justified by the following:  hemodynamic instability despite optimal treatment ( hypoxia,  )  Severe lab/radiological/exam abnormalities including:    Right hip fracture   That are currently affecting medical management.   I expect  patient to be hospitalized for 2 midnights requiring inpatient medical care.  Patient is at high risk for adverse outcome (such as loss of life or disability) if not treated.  Indication for inpatient stay as follows:    New or worsening hypoxia      Level of care     tele  For 12H      Lab Results  Component Value Date   [place] NEGATIVE 05/18/2022     Precautions: admitted as   Covid Negative     Jaydence Vanyo 05/18/2022, 10:28 PM    [facility]     after 2 AM please page floor coverage PA If 7AM-7PM, please contact the day team taking care of the patient using [email or web address]   Patient was evaluated in the context of the global COVID-19 pandemic, which necessitated consideration that the patient might be at risk for infection with the SARS-CoV-2 virus that causes COVID-19. Institutional protocols and algorithms that pertain to the evaluation of patients at risk for COVID-19 are in a state of rapid change based on information released by regulatory bodies including the CDC and federal and state organizations. These policies and algorithms were followed during the patient's care.

## 2022-05-18 NOTE — Subjective & Objective (Signed)
Mechanical fall on right hip  Have not seen any PCP for 4 years Does not smoke was found to be hypoxic down to 88% on Ra no hx of needing O2 prior

## 2022-05-18 NOTE — Progress Notes (Signed)
Patient with a fall and R displaced femoral neck fracture. Will obtain CT R hip to determine if alignment acceptable for hip pinning versus THA. Please keep patient NPO after midnight. Possible surgery tomorrow pending medical clearance and OR availability.

## 2022-05-19 ENCOUNTER — Encounter (HOSPITAL_COMMUNITY): Payer: Self-pay | Admitting: Anesthesiology

## 2022-05-19 ENCOUNTER — Other Ambulatory Visit (HOSPITAL_COMMUNITY): Payer: PRIVATE HEALTH INSURANCE

## 2022-05-19 ENCOUNTER — Encounter (HOSPITAL_COMMUNITY)
Admission: EM | Disposition: A | Discharge status: Home Health | Payer: Self-pay | Source: Home / Self Care | Attending: Family Medicine

## 2022-05-19 ENCOUNTER — Inpatient Hospital Stay (HOSPITAL_COMMUNITY): Payer: PRIVATE HEALTH INSURANCE

## 2022-05-19 ENCOUNTER — Other Ambulatory Visit: Payer: Self-pay

## 2022-05-19 ENCOUNTER — Inpatient Hospital Stay (HOSPITAL_COMMUNITY): Payer: No Typology Code available for payment source

## 2022-05-19 ENCOUNTER — Inpatient Hospital Stay (HOSPITAL_COMMUNITY): Payer: No Typology Code available for payment source | Admitting: Certified Registered Nurse Anesthetist

## 2022-05-19 ENCOUNTER — Encounter (HOSPITAL_COMMUNITY): Payer: Self-pay | Admitting: Internal Medicine

## 2022-05-19 DIAGNOSIS — J849 Interstitial pulmonary disease, unspecified: Secondary | ICD-10-CM

## 2022-05-19 DIAGNOSIS — R0609 Other forms of dyspnea: Secondary | ICD-10-CM

## 2022-05-19 DIAGNOSIS — S72001A Fracture of unspecified part of neck of right femur, initial encounter for closed fracture: Secondary | ICD-10-CM

## 2022-05-19 HISTORY — PX: HIP PINNING,CANNULATED: SHX1758

## 2022-05-19 LAB — ECHOCARDIOGRAM COMPLETE
Area-P 1/2: 3.24 cm2
Height: 65 in
S' Lateral: 1.7 cm
Weight: 1536 oz

## 2022-05-19 LAB — POCT I-STAT EG7
Acid-Base Excess: 1 mmol/L (ref 0.0–2.0)
Bicarbonate: 23.4 mmol/L (ref 20.0–28.0)
Calcium, Ion: 1.08 mmol/L — ABNORMAL LOW (ref 1.15–1.40)
HCT: 41 % (ref 36.0–46.0)
Hemoglobin: 13.9 g/dL (ref 12.0–15.0)
O2 Saturation: 95 %
Potassium: 4 mmol/L (ref 3.5–5.1)
Sodium: 135 mmol/L (ref 135–145)
TCO2: 24 mmol/L (ref 22–32)
pCO2, Ven: 30.1 mmHg — ABNORMAL LOW (ref 44–60)
pH, Ven: 7.499 — ABNORMAL HIGH (ref 7.25–7.43)
pO2, Ven: 67 mmHg — ABNORMAL HIGH (ref 32–45)

## 2022-05-19 LAB — CBC WITH DIFFERENTIAL/PLATELET
Abs Immature Granulocytes: 0.04 10*3/uL (ref 0.00–0.07)
Basophils Absolute: 0.1 10*3/uL (ref 0.0–0.1)
Basophils Relative: 0 %
Eosinophils Absolute: 0.2 10*3/uL (ref 0.0–0.5)
Eosinophils Relative: 2 %
HCT: 40.9 % (ref 36.0–46.0)
Hemoglobin: 13.4 g/dL (ref 12.0–15.0)
Immature Granulocytes: 0 %
Lymphocytes Relative: 16 %
Lymphs Abs: 2.2 10*3/uL (ref 0.7–4.0)
MCH: 32.3 pg (ref 26.0–34.0)
MCHC: 32.8 g/dL (ref 30.0–36.0)
MCV: 98.6 fL (ref 80.0–100.0)
Monocytes Absolute: 1.4 10*3/uL — ABNORMAL HIGH (ref 0.1–1.0)
Monocytes Relative: 10 %
Neutro Abs: 10 10*3/uL — ABNORMAL HIGH (ref 1.7–7.7)
Neutrophils Relative %: 72 %
Platelets: 242 10*3/uL (ref 150–400)
RBC: 4.15 MIL/uL (ref 3.87–5.11)
RDW: 13.5 % (ref 11.5–15.5)
WBC: 13.9 10*3/uL — ABNORMAL HIGH (ref 4.0–10.5)
nRBC: 0 % (ref 0.0–0.2)

## 2022-05-19 LAB — ABO/RH: ABO/RH(D): O POS

## 2022-05-19 LAB — HEPATIC FUNCTION PANEL
ALT: 17 U/L (ref 0–44)
AST: 22 U/L (ref 15–41)
Albumin: 3.6 g/dL (ref 3.5–5.0)
Alkaline Phosphatase: 51 U/L (ref 38–126)
Bilirubin, Direct: 0.3 mg/dL — ABNORMAL HIGH (ref 0.0–0.2)
Indirect Bilirubin: 1 mg/dL — ABNORMAL HIGH (ref 0.3–0.9)
Total Bilirubin: 1.3 mg/dL — ABNORMAL HIGH (ref 0.3–1.2)
Total Protein: 6.7 g/dL (ref 6.5–8.1)

## 2022-05-19 LAB — SURGICAL PCR SCREEN
MRSA, PCR: NEGATIVE
Staphylococcus aureus: NEGATIVE

## 2022-05-19 LAB — TROPONIN I (HIGH SENSITIVITY): Troponin I (High Sensitivity): 6 ng/L (ref <18)

## 2022-05-19 LAB — TYPE AND SCREEN
ABO/RH(D): O POS
Antibody Screen: NEGATIVE

## 2022-05-19 LAB — TSH: TSH: 2.798 u[IU]/mL (ref 0.350–4.500)

## 2022-05-19 LAB — CK: Total CK: 78 U/L (ref 38–234)

## 2022-05-19 LAB — MAGNESIUM: Magnesium: 1.9 mg/dL (ref 1.7–2.4)

## 2022-05-19 LAB — SEDIMENTATION RATE: Sed Rate: 8 mm/hr (ref 0–22)

## 2022-05-19 LAB — PREALBUMIN: Prealbumin: 15 mg/dL — ABNORMAL LOW (ref 18–38)

## 2022-05-19 LAB — PHOSPHORUS: Phosphorus: 3 mg/dL (ref 2.5–4.6)

## 2022-05-19 LAB — HIV ANTIBODY (ROUTINE TESTING W REFLEX): HIV Screen 4th Generation wRfx: NONREACTIVE

## 2022-05-19 LAB — VITAMIN D 25 HYDROXY (VIT D DEFICIENCY, FRACTURES): Vit D, 25-Hydroxy: 6.39 ng/mL — ABNORMAL LOW (ref 30–100)

## 2022-05-19 LAB — C-REACTIVE PROTEIN: CRP: 1.7 mg/dL — ABNORMAL HIGH (ref <1.0)

## 2022-05-19 SURGERY — FIXATION, FEMUR, NECK, PERCUTANEOUS, USING SCREW
Anesthesia: Monitor Anesthesia Care | Site: Hip | Laterality: Right

## 2022-05-19 MED ORDER — FLUTICASONE FUROATE-VILANTEROL 100-25 MCG/ACT IN AEPB
1.0000 | INHALATION_SPRAY | Freq: Every day | RESPIRATORY_TRACT | Status: DC
  Administered 2022-05-20 – 2022-05-21 (×2): 1 via RESPIRATORY_TRACT
  Filled 2022-05-19 (×2): qty 28

## 2022-05-19 MED ORDER — PANTOPRAZOLE SODIUM 40 MG IV SOLR
40.0000 mg | Freq: Every day | INTRAVENOUS | Status: DC
  Administered 2022-05-19 – 2022-05-20 (×2): 40 mg via INTRAVENOUS
  Filled 2022-05-19 (×2): qty 10

## 2022-05-19 MED ORDER — OXYCODONE HCL 5 MG PO TABS: 5.0000 mg | ORAL_TABLET | Freq: Once | ORAL | Status: DC | PRN

## 2022-05-19 MED ORDER — METOCLOPRAMIDE HCL 5 MG PO TABS: 5.0000 mg | ORAL_TABLET | Freq: Three times a day (TID) | ORAL | Status: DC | PRN

## 2022-05-19 MED ORDER — IOHEXOL 9 MG/ML PO SOLN: 500.0000 mL | ORAL | Status: AC

## 2022-05-19 MED ORDER — CHLORHEXIDINE GLUCONATE 0.12 % MT SOLN
OROMUCOSAL | Status: AC
  Administered 2022-05-19: 15 mL via OROMUCOSAL
  Filled 2022-05-19: qty 15

## 2022-05-19 MED ORDER — METOCLOPRAMIDE HCL 5 MG/ML IJ SOLN: 5.0000 mg | Freq: Three times a day (TID) | INTRAMUSCULAR | Status: DC | PRN

## 2022-05-19 MED ORDER — SODIUM CHLORIDE 0.9 % IV SOLN: INTRAVENOUS | Status: DC

## 2022-05-19 MED ORDER — OXYCODONE HCL 5 MG/5ML PO SOLN: 5.0000 mg | Freq: Once | ORAL | Status: DC | PRN

## 2022-05-19 MED ORDER — PHENYLEPHRINE HCL (PRESSORS) 10 MG/ML IV SOLN
INTRAVENOUS | Status: AC
  Filled 2022-05-19: qty 1

## 2022-05-19 MED ORDER — ORAL CARE MOUTH RINSE: 15.0000 mL | Freq: Once | OROMUCOSAL | Status: AC

## 2022-05-19 MED ORDER — CEFAZOLIN SODIUM-DEXTROSE 2-3 GM-%(50ML) IV SOLR
INTRAVENOUS | Status: DC | PRN
  Administered 2022-05-19: 2 g via INTRAVENOUS

## 2022-05-19 MED ORDER — VANCOMYCIN HCL 1000 MG IV SOLR
INTRAVENOUS | Status: AC
  Filled 2022-05-19: qty 20

## 2022-05-19 MED ORDER — CEFAZOLIN SODIUM 1 G IJ SOLR
INTRAMUSCULAR | Status: AC
  Filled 2022-05-19: qty 20

## 2022-05-19 MED ORDER — HYDROCODONE-ACETAMINOPHEN 7.5-325 MG PO TABS: 1.0000 | ORAL_TABLET | ORAL | Status: DC | PRN

## 2022-05-19 MED ORDER — ONDANSETRON HCL 4 MG PO TABS: 4.0000 mg | ORAL_TABLET | Freq: Four times a day (QID) | ORAL | Status: DC | PRN

## 2022-05-19 MED ORDER — CHLORHEXIDINE GLUCONATE 0.12 % MT SOLN: 15.0000 mL | Freq: Once | OROMUCOSAL | Status: AC

## 2022-05-19 MED ORDER — PROPOFOL 10 MG/ML IV BOLUS
INTRAVENOUS | Status: AC
  Filled 2022-05-19: qty 20

## 2022-05-19 MED ORDER — LACTATED RINGERS IV SOLN: INTRAVENOUS | Status: DC

## 2022-05-19 MED ORDER — HYDROCODONE-ACETAMINOPHEN 5-325 MG PO TABS
1.0000 | ORAL_TABLET | Freq: Four times a day (QID) | ORAL | Status: DC | PRN
  Administered 2022-05-19: 2 via ORAL
  Filled 2022-05-19: qty 2

## 2022-05-19 MED ORDER — PROPOFOL 500 MG/50ML IV EMUL
INTRAVENOUS | Status: DC | PRN
  Administered 2022-05-19: 25 ug/kg/min via INTRAVENOUS

## 2022-05-19 MED ORDER — FENTANYL CITRATE (PF) 100 MCG/2ML IJ SOLN: 25.0000 ug | INTRAMUSCULAR | Status: DC | PRN

## 2022-05-19 MED ORDER — PROPOFOL 1000 MG/100ML IV EMUL
INTRAVENOUS | Status: AC
  Filled 2022-05-19: qty 100

## 2022-05-19 MED ORDER — PHENYLEPHRINE HCL-NACL 20-0.9 MG/250ML-% IV SOLN
INTRAVENOUS | Status: DC | PRN
  Administered 2022-05-19: 25 ug/min via INTRAVENOUS

## 2022-05-19 MED ORDER — ACETAMINOPHEN 325 MG PO TABS
325.0000 mg | ORAL_TABLET | Freq: Four times a day (QID) | ORAL | Status: DC | PRN
  Administered 2022-05-20 (×2): 650 mg via ORAL
  Administered 2022-05-20: 500 mg via ORAL
  Administered 2022-05-21: 650 mg via ORAL
  Filled 2022-05-19 (×3): qty 2

## 2022-05-19 MED ORDER — ONDANSETRON HCL 4 MG/2ML IJ SOLN: 4.0000 mg | Freq: Four times a day (QID) | INTRAMUSCULAR | Status: DC | PRN

## 2022-05-19 MED ORDER — VITAMIN D (ERGOCALCIFEROL) 1.25 MG (50000 UNIT) PO CAPS
50000.0000 [IU] | ORAL_CAPSULE | ORAL | Status: DC
  Administered 2022-05-19: 50000 [IU] via ORAL
  Filled 2022-05-19: qty 1

## 2022-05-19 MED ORDER — BUPIVACAINE IN DEXTROSE 0.75-8.25 % IT SOLN
INTRATHECAL | Status: DC | PRN
  Administered 2022-05-19: 1.4 mL via INTRATHECAL

## 2022-05-19 MED ORDER — METHOCARBAMOL 1000 MG/10ML IJ SOLN: 500.0000 mg | Freq: Four times a day (QID) | INTRAVENOUS | Status: DC | PRN

## 2022-05-19 MED ORDER — CEFAZOLIN SODIUM-DEXTROSE 2-4 GM/100ML-% IV SOLN
2.0000 g | Freq: Three times a day (TID) | INTRAVENOUS | Status: AC
  Administered 2022-05-19 – 2022-05-20 (×3): 2 g via INTRAVENOUS
  Filled 2022-05-19 (×3): qty 100

## 2022-05-19 MED ORDER — IOHEXOL 350 MG/ML SOLN
75.0000 mL | Freq: Once | INTRAVENOUS | Status: AC | PRN
  Administered 2022-05-19: 75 mL via INTRAVENOUS

## 2022-05-19 MED ORDER — ENOXAPARIN SODIUM 30 MG/0.3ML IJ SOSY
30.0000 mg | PREFILLED_SYRINGE | INTRAMUSCULAR | Status: DC
  Administered 2022-05-20 – 2022-05-21 (×2): 30 mg via SUBCUTANEOUS
  Filled 2022-05-19 (×2): qty 0.3

## 2022-05-19 MED ORDER — METHOCARBAMOL 500 MG PO TABS
500.0000 mg | ORAL_TABLET | Freq: Four times a day (QID) | ORAL | Status: DC | PRN
  Administered 2022-05-19 – 2022-05-20 (×3): 500 mg via ORAL
  Filled 2022-05-19 (×3): qty 1

## 2022-05-19 MED ORDER — DOCUSATE SODIUM 100 MG PO CAPS
100.0000 mg | ORAL_CAPSULE | Freq: Two times a day (BID) | ORAL | Status: DC
  Administered 2022-05-19 – 2022-05-21 (×5): 100 mg via ORAL
  Filled 2022-05-19 (×5): qty 1

## 2022-05-19 MED ORDER — HYDROCODONE-ACETAMINOPHEN 5-325 MG PO TABS
1.0000 | ORAL_TABLET | ORAL | Status: DC | PRN
  Administered 2022-05-19 (×2): 2 via ORAL
  Filled 2022-05-19 (×2): qty 2

## 2022-05-19 MED ORDER — MORPHINE SULFATE (PF) 2 MG/ML IV SOLN
0.5000 mg | INTRAVENOUS | Status: DC | PRN
  Administered 2022-05-19 (×2): 0.5 mg via INTRAVENOUS
  Filled 2022-05-19 (×2): qty 1

## 2022-05-19 MED ORDER — POLYETHYLENE GLYCOL 3350 17 G PO PACK: 17.0000 g | PACK | Freq: Every day | ORAL | Status: DC | PRN

## 2022-05-19 MED ORDER — FENTANYL CITRATE (PF) 250 MCG/5ML IJ SOLN
INTRAMUSCULAR | Status: DC | PRN
  Administered 2022-05-19: 25 ug via INTRAVENOUS

## 2022-05-19 MED ORDER — MIDAZOLAM HCL 2 MG/2ML IJ SOLN
INTRAMUSCULAR | Status: AC
  Filled 2022-05-19: qty 2

## 2022-05-19 MED ORDER — 0.9 % SODIUM CHLORIDE (POUR BTL) OPTIME
TOPICAL | Status: DC | PRN
  Administered 2022-05-19: 1000 mL

## 2022-05-19 MED ORDER — ENSURE ENLIVE PO LIQD
237.0000 mL | Freq: Two times a day (BID) | ORAL | Status: DC
  Administered 2022-05-20 – 2022-05-21 (×3): 237 mL via ORAL

## 2022-05-19 MED ORDER — FENTANYL CITRATE (PF) 250 MCG/5ML IJ SOLN
INTRAMUSCULAR | Status: AC
  Filled 2022-05-19: qty 5

## 2022-05-19 MED ORDER — VANCOMYCIN HCL 1000 MG IV SOLR
INTRAVENOUS | Status: DC | PRN
  Administered 2022-05-19: 1000 mg

## 2022-05-19 SURGICAL SUPPLY — 52 items
ADH SKN CLS APL DERMABOND .7 (GAUZE/BANDAGES/DRESSINGS) ×1
APL PRP STRL LF DISP 70% ISPRP (MISCELLANEOUS) ×1
BAG COUNTER SPONGE SURGICOUNT (BAG) ×2 IMPLANT
BAG SPNG CNTER NS LX DISP (BAG) ×1
BIT DRILL 4.3X413 (BIT) ×1
BIT DRILL CANN 4.3X413 (BIT) IMPLANT
BIT DRILL OPENING REAMER ASSEM (INSTRUMENTS) ×1
BNDG COHESIVE 6X5 TAN STRL LF (GAUZE/BANDAGES/DRESSINGS) ×2 IMPLANT
BOLT FEM NECK 75 L (Bolt) IMPLANT
BRUSH SCRUB EZ PLAIN DRY (MISCELLANEOUS) ×2 IMPLANT
CHLORAPREP W/TINT 26 (MISCELLANEOUS) ×2 IMPLANT
COVER SURGICAL LIGHT HANDLE (MISCELLANEOUS) ×4 IMPLANT
DERMABOND ADVANCED .7 DNX12 (GAUZE/BANDAGES/DRESSINGS) ×2 IMPLANT
DRAPE C-ARMOR (DRAPES) ×2 IMPLANT
DRAPE HALF SHEET 40X57 (DRAPES) ×4 IMPLANT
DRAPE IMP U-DRAPE 54X76 (DRAPES) ×4 IMPLANT
DRAPE INCISE IOBAN 66X45 STRL (DRAPES) IMPLANT
DRAPE ORTHO SPLIT 77X108 STRL (DRAPES) ×2
DRAPE STERI IOBAN 125X83 (DRAPES) ×2 IMPLANT
DRAPE SURG 17X23 STRL (DRAPES) ×2 IMPLANT
DRAPE SURG ORHT 6 SPLT 77X108 (DRAPES) ×4 IMPLANT
DRAPE U-SHAPE 47X51 STRL (DRAPES) ×2 IMPLANT
DRESSING MEPILEX FLEX 4X4 (GAUZE/BANDAGES/DRESSINGS) IMPLANT
DRSG MEPILEX BORDER 4X4 (GAUZE/BANDAGES/DRESSINGS) ×2 IMPLANT
DRSG MEPILEX FLEX 4X4 (GAUZE/BANDAGES/DRESSINGS) ×1
ELECT REM PT RETURN 9FT ADLT (ELECTROSURGICAL) ×1
ELECTRODE REM PT RTRN 9FT ADLT (ELECTROSURGICAL) ×2 IMPLANT
GLOVE BIO SURGEON STRL SZ 6.5 (GLOVE) ×6 IMPLANT
GLOVE BIO SURGEON STRL SZ7.5 (GLOVE) ×8 IMPLANT
GLOVE BIOGEL PI IND STRL 6.5 (GLOVE) ×2 IMPLANT
GLOVE BIOGEL PI IND STRL 7.5 (GLOVE) ×2 IMPLANT
GOWN STRL REUS W/ TWL LRG LVL3 (GOWN DISPOSABLE) ×4 IMPLANT
GOWN STRL REUS W/TWL LRG LVL3 (GOWN DISPOSABLE) ×2
GUIDEWIRE 3.2X400 (WIRE) IMPLANT
KIT BASIN OR (CUSTOM PROCEDURE TRAY) ×2 IMPLANT
KIT TURNOVER KIT B (KITS) ×2 IMPLANT
MANIFOLD NEPTUNE II (INSTRUMENTS) ×2 IMPLANT
NS IRRIG 1000ML POUR BTL (IV SOLUTION) ×2 IMPLANT
PACK GENERAL/GYN (CUSTOM PROCEDURE TRAY) ×2 IMPLANT
PAD ARMBOARD 7.5X6 YLW CONV (MISCELLANEOUS) ×4 IMPLANT
PLATE FEM NECK 1H (Plate) IMPLANT
REAMER DRILL BIT 10.2X251 (INSTRUMENTS) IMPLANT
SCREW ANTIROTATN FEM NECK 75 L (Screw) IMPLANT
SCREW TI LCP 5X42 COMP T25 (Screw) IMPLANT
STAPLER VISISTAT 35W (STAPLE) ×2 IMPLANT
STOCKINETTE IMPERVIOUS LG (DRAPES) ×2 IMPLANT
SUT MNCRL AB 3-0 PS2 18 (SUTURE) ×2 IMPLANT
SUT VIC AB 2-0 CT1 27 (SUTURE) ×1
SUT VIC AB 2-0 CT1 TAPERPNT 27 (SUTURE) ×2 IMPLANT
TOWEL GREEN STERILE (TOWEL DISPOSABLE) ×4 IMPLANT
TOWEL GREEN STERILE FF (TOWEL DISPOSABLE) ×2 IMPLANT
WATER STERILE IRR 1000ML POUR (IV SOLUTION) ×2 IMPLANT

## 2022-05-19 NOTE — Progress Notes (Signed)
Echocardiogram 2D Echocardiogram has been performed.  Oneal Deputy Lovey Crupi RDCS 05/19/2022, 3:25 PM
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
## 2022-05-19 NOTE — Transfer of Care (Signed)
Immediate Anesthesia Transfer of Care Note  Patient: [name]  Procedure(s) Performed: PERCUTANEOUS FIXATION OF FEMORAL NECK (Right: Hip)  Patient Location: PACU  Anesthesia Type:MAC and Spinal  Level of Consciousness: awake, alert , oriented, patient cooperative, and responds to stimulation  Airway & Oxygen Therapy: Patient Spontanous Breathing and Patient connected to face mask oxygen  Post-op Assessment: Report given to RN and Post -op Vital signs reviewed and stable  Post vital signs: Reviewed and stable  Last Vitals:  Vitals Value Taken Time  BP    Temp    Pulse    Resp    SpO2      Last Pain:  Vitals:   05/19/22 0843  TempSrc:   PainSc: 0-No pain         Complications: No notable events documented.

## 2022-05-19 NOTE — ED Notes (Signed)
Patient is awake and alert this morning, family present at bedside, she is aware that she is going upstairs to short stay and  that she has a surgery to correct her hip fx.
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
## 2022-05-19 NOTE — TOC CM/SW Note (Addendum)
05-19-2022  S/W AMY @ [facility]: CONTACT  ERIKA A. FOR D/C PLANNING NEEDS, DME, HH, SNF, REHAB. PHONE # [phone]  X [phone] and FAX # [phone]
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
## 2022-05-19 NOTE — Anesthesia Postprocedure Evaluation (Signed)
Anesthesia Post Note  Patient: [name]  Procedure(s) Performed: PERCUTANEOUS FIXATION OF FEMORAL NECK (Right: Hip)     Patient location during evaluation: PACU Anesthesia Type: MAC and Spinal Level of consciousness: awake and alert Pain management: pain level controlled Vital Signs Assessment: post-procedure vital signs reviewed and stable Respiratory status: spontaneous breathing, nonlabored ventilation, respiratory function stable and patient connected to nasal cannula oxygen Cardiovascular status: stable and blood pressure returned to baseline Postop Assessment: no apparent nausea or vomiting Anesthetic complications: no   No notable events documented.  Last Vitals:  Vitals:   05/19/22 1215 05/19/22 1221  BP: 95/64   Pulse: 63 61  Resp: 20 16  Temp:    SpO2: 99% 96%    Last Pain:  Vitals:   05/19/22 1215  TempSrc:   PainSc: 0-No pain                 Geoffry Bannister S

## 2022-05-19 NOTE — Anesthesia Pain Management Evaluation Note (Signed)
  Anesthesia Pain Consult Note  Patient: [name], 56 y.o., female  Consult Requested by: Darliss Cheney, MD  Reason for Consult: Right femoral neck fracture  Level of Consciousness: alert  Pain: moderate   Last Vitals:  Vitals:   05/19/22 0400 05/19/22 0600  BP: 118/74 95/60  Pulse: 70 (!) 56  Resp: 19 13  Temp:  36.6 C  SpO2: 95% 94%    Plan: Peripheral nerve block for pain control  Consent:Risks of procedure as well as the alternatives and risks of each were explained to the (patient/caregiver).  Consent for procedure obtained.  No Known Allergies  Physical exam: PULM normal and clear to auscultation  CARDIO Heart sounds are normal.  Regular rate and rhythm without murmur, gallop or rub.  OTHER    I have reviewed the patient's medications listed below.  . methocarbamol (ROBAXIN) IV     HYDROcodone-acetaminophen, methocarbamol **OR** methocarbamol (ROBAXIN) IV, morphine injection  No past medical history on file. No past surgical history on file.  reports that she has never smoked. She has never used smokeless tobacco. She reports that she does not currently use drugs. She reports that she does not drink alcohol.    Cricket Goodlin S 05/19/2022

## 2022-05-19 NOTE — Op Note (Signed)
Orthopaedic Surgery Operative Note (CSN: [id number] ) Date of Surgery: 05/19/2022  Admit Date: 05/18/2022   Diagnoses: Pre-Op Diagnoses: Right valgus impacted femoral neck fracture  Post-Op Diagnosis: Same  Procedures: CPT 27235-Percutaneous fixation of right femoral neck  Surgeons : Primary: Aryel Edelen, Gillie Manners, MD  Assistant: Ulyses Southward, PA-C  Location: OR 3   Anesthesia:Spinal  Antibiotics: Ancef 2g preop with 1 gm vancomycin powder placed topically   Tourniquet time: None    Estimated Blood Loss: Minimal  Complications:None   Specimens:None   Implants: Implant Name Type Inv. Item Serial No. Manufacturer Lot No. LRB No. Used Action  PLATE FEM NECK 1H - [id number] Plate PLATE FEM NECK 1H  [employer] [id number] Right 1 Implanted  BOLT FEM NECK 75 L - [id number] Bolt BOLT FEM NECK 75 L  [employer] [id number] Right 1 Implanted  SCREW ANTIROTATN FEM NECK 75 L - [id number] Screw SCREW ANTIROTATN FEM NECK 75 L  [employer] [id number] Right 1 Implanted     Indications for Surgery: 56 year old female who sustained a ground-level fall and a right valgus impacted femoral neck fracture.  Due to the unstable nature of her injury I discussed percutaneous fixation versus arthroplasty.  Due to the good alignment I felt that percutaneous fixation would be appropriate.  Risks and benefits were discussed with the patient and her husband.  Risks included but not limited to bleeding, infection, malunion, nonunion, hardware failure, hardware irritation, nerve or need for arthroplasty in the future, even the possibility anesthetic complications.  They agreed to proceed with surgery and consent was obtained.  Operative Findings: Percutaneous fixation of right valgus impacted femoral neck fracture using [employer] femoral neck system  Procedure: The patient was identified in the preoperative holding area. Consent was confirmed with the patient and their family and all questions were  answered. The operative extremity was marked after confirmation with the patient. she was then brought back to the operating room by our anesthesia colleagues.  She was placed under spinal anesthetic and carefully transferred over to radiolucent flat top table.  A bump was placed under her operative hip.  The right lower extremity was then prepped and draped in usual sterile fashion.  A timeout was performed to verify the patient, the procedure, and the extremity.  Preoperative antibiotics were dosed.  Fluoroscopic imaging showed the unstable nature of her injury.  A small lateral incision was then made and carried down through skin and subcutaneous tissue.  I incised through the IT band and split the vastus lateralis in line with my incision.  I then used the guide to place a K wire into the head/neck segment.  I confirmed adequate tip apex distance with AP and lateral fluoroscopic imaging.  I then measured the length and chose to use a 75 mm device.  I then drilled a path for the device under fluoroscopic imaging.  I then attached the 75 mm device to the targeting arm and placed it within drilled path.  I then drilled and placed a locking screw into the lesser trochanter.  I then drilled and placed the antirotation screw through the device and then locked this in place.  The targeting arm was removed.  Final fluoroscopic imaging was obtained.  The incision was copiously irrigated.  A gram of vancomycin powder was placed into the incision.  A layered closure of 0 Vicryl, 2-0 Vicryl and 3-0 Monocryl with Dermabond was used to close the skin.  Sterile dressings were applied.  The patient was awoken from  anesthesia and taken to the PACU in stable condition.  Post Op Plan/Instructions: Patient will be weightbearing as tolerated to right lower extremity.  She will receive postoperative Ancef.  She will receive Lovenox for DVT prophylaxis while in the hospital and discharged on aspirin 325 mg daily.  We will have  her mobilize with physical and Occupational Therapy.  I was present and performed the entire surgery.  Patrecia Pace, PA-C did assist me throughout the case. An assistant was necessary given the difficulty in approach, maintenance of reduction and ability to instrument the fracture.   Katha Hamming, MD [facility]

## 2022-05-19 NOTE — Anesthesia Procedure Notes (Signed)
Spinal  Patient location during procedure: OR Start time: 05/19/2022 10:48 AM End time: 05/19/2022 10:51 AM Reason for block: surgical anesthesia Staffing Performed: anesthesiologist  Anesthesiologist: Albertha Ghee, MD Performed by: Albertha Ghee, MD Authorized by: Albertha Ghee, MD   Preanesthetic Checklist Completed: patient identified, IV checked, risks and benefits discussed, surgical consent, monitors and equipment checked, pre-op evaluation and timeout performed Spinal Block Patient position: right lateral decubitus Prep: DuraPrep Patient monitoring: cardiac monitor, continuous pulse ox and blood pressure Approach: midline Injection technique: single-shot Needle Needle type: Pencan  Needle gauge: 24 G Needle length: 9 cm Assessment Sensory level: T10 Events: CSF return Additional Notes Functioning IV was confirmed and monitors were applied. Sterile prep and drape, including hand hygiene and sterile gloves were used. The patient was positioned and the spine was prepped. The skin was anesthetized with lidocaine.  Free flow of clear CSF was obtained prior to injecting local anesthetic into the CSF.  The spinal needle aspirated freely following injection.  The needle was carefully withdrawn.  The patient tolerated the procedure well.
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
## 2022-05-19 NOTE — Anesthesia Preprocedure Evaluation (Signed)
Anesthesia Evaluation  Patient identified by MRN, date of birth, ID band Patient awake    Reviewed: Allergy & Precautions, H&P , NPO status , Patient's Chart, lab work & pertinent test results  Airway Mallampati: II   Neck ROM: full    Dental   Pulmonary shortness of breath,    breath sounds clear to auscultation       Cardiovascular negative cardio ROS   Rhythm:regular Rate:Normal     Neuro/Psych negative neurological ROS     GI/Hepatic   Endo/Other    Renal/GU      Musculoskeletal   Abdominal   Peds  Hematology   Anesthesia Other Findings   Reproductive/Obstetrics                             Anesthesia Physical Anesthesia Plan  ASA: 2  Anesthesia Plan: General   Post-op Pain Management:    Induction: Intravenous  PONV Risk Score and Plan: 3 and Ondansetron, Dexamethasone, Midazolam and Treatment may vary due to age or medical condition  Airway Management Planned: Oral ETT  Additional Equipment:   Intra-op Plan:   Post-operative Plan: Extubation in OR  Informed Consent: I have reviewed the patients History and Physical, chart, labs and discussed the procedure including the risks, benefits and alternatives for the proposed anesthesia with the patient or authorized representative who has indicated his/her understanding and acceptance.     Dental advisory given  Plan Discussed with: CRNA, Anesthesiologist and Surgeon  Anesthesia Plan Comments:         Anesthesia Quick Evaluation
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
## 2022-05-19 NOTE — Interval H&P Note (Signed)
History and Physical Interval Note:  05/19/2022 10:17 AM  [name]  has presented today for surgery, with the diagnosis of Right femoral neck fracture.  The various methods of treatment have been discussed with the patient and family. After consideration of risks, benefits and other options for treatment, the patient has consented to  Procedure(s) with comments: PERCUTANEOUS FIXATION OF FEMORAL NECK (Right) - Supine, handy table, Synthes FNS, c-arm 16 as a surgical intervention.  The patient's history has been reviewed, patient examined, no change in status, stable for surgery.  I have reviewed the patient's chart and labs.  Questions were answered to the patient's satisfaction.     Lennette Bihari P Carrie Schoonmaker

## 2022-05-20 ENCOUNTER — Inpatient Hospital Stay (HOSPITAL_COMMUNITY): Payer: PRIVATE HEALTH INSURANCE

## 2022-05-20 DIAGNOSIS — R9389 Abnormal findings on diagnostic imaging of other specified body structures: Secondary | ICD-10-CM

## 2022-05-20 LAB — BASIC METABOLIC PANEL
Anion gap: 5 (ref 5–15)
BUN: 10 mg/dL (ref 6–20)
CO2: 27 mmol/L (ref 22–32)
Calcium: 8.7 mg/dL — ABNORMAL LOW (ref 8.9–10.3)
Chloride: 106 mmol/L (ref 98–111)
Creatinine, Ser: 0.65 mg/dL (ref 0.44–1.00)
GFR, Estimated: >60 mL/min (ref >60)
Glucose, Bld: 104 mg/dL — ABNORMAL HIGH (ref 70–99)
Potassium: 4.2 mmol/L (ref 3.5–5.1)
Sodium: 138 mmol/L (ref 135–145)

## 2022-05-20 LAB — CBC
HCT: 38.7 % (ref 36.0–46.0)
Hemoglobin: 13.1 g/dL (ref 12.0–15.0)
MCH: 33 pg (ref 26.0–34.0)
MCHC: 33.9 g/dL (ref 30.0–36.0)
MCV: 97.5 fL (ref 80.0–100.0)
Platelets: 256 10*3/uL (ref 150–400)
RBC: 3.97 MIL/uL (ref 3.87–5.11)
RDW: 13.6 % (ref 11.5–15.5)
WBC: 12.2 10*3/uL — ABNORMAL HIGH (ref 4.0–10.5)
nRBC: 0 % (ref 0.0–0.2)

## 2022-05-20 LAB — SEDIMENTATION RATE: Sed Rate: 10 mm/hr (ref 0–22)

## 2022-05-20 LAB — C-REACTIVE PROTEIN: CRP: 8.6 mg/dL — ABNORMAL HIGH (ref <1.0)

## 2022-05-20 NOTE — Evaluation (Signed)
Physical Therapy Evaluation Patient Details Name: [name] MRN: [id number] DOB: Dec 06, 1965 Today's Date: 05/20/2022  History of Present Illness  56 y/o female presented to ED on 05/18/22 after fall on cement. Sustained R valgus impacted femoral neck fx. S/p percutaneous fixation of R femoral neck on 10/13. Also found to be in acute respiratory failure with possible metastatic process on CT chest. No significant PMH.  Clinical Impression  Patient admitted with the above. PTA, patient lives with husband and was working full time. Patient presents with weakness, impaired balance, and decreased activity tolerance. Patient overall functioning at supervision level for mobility with use of RW. Encouraged continued mobility with nursing staff and mobility specialists. Patient will benefit from skilled PT services during acute stay to address listed deficits. Recommend HHPT at discharge to maximize functional independence and safety in the home.        Recommendations for follow up therapy are one component of a multi-disciplinary discharge planning process, led by the attending physician.  Recommendations may be updated based on patient status, additional functional criteria and insurance authorization.  Follow Up Recommendations Home health PT      Assistance Recommended at Discharge Intermittent Supervision/Assistance  Patient can return home with the following       Equipment Recommendations Rolling Christoph Copelan (2 wheels)  Recommendations for Other Services       Functional Status Assessment Patient has had a recent decline in their functional status and demonstrates the ability to make significant improvements in function in a reasonable and predictable amount of time.     Precautions / Restrictions Precautions Precautions: Fall Restrictions Weight Bearing Restrictions: Yes RLE Weight Bearing: Weight bearing as tolerated      Mobility  Bed Mobility Overal bed mobility: Needs  Assistance Bed Mobility: Supine to Sit     Supine to sit: Supervision          Transfers Overall transfer level: Needs assistance Equipment used: Rolling Tamyah Cutbirth (2 wheels) Transfers: Sit to/from Stand Sit to Stand: Supervision                Ambulation/Gait Ambulation/Gait assistance: Supervision Gait Distance (Feet): 25 Feet Assistive device: Rolling Abena Erdman (2 wheels) Gait Pattern/deviations: Step-through pattern, Decreased stride length, Decreased stance time - right Gait velocity: decreased     General Gait Details: supervision for safety. short step through pattern. No overt LOB  Stairs            Wheelchair Mobility    Modified Rankin (Stroke Patients Only)       Balance Overall balance assessment: Needs assistance Sitting-balance support: Feet supported, Bilateral upper extremity supported Sitting balance-Leahy Scale: Good     Standing balance support: Reliant on assistive device for balance Standing balance-Leahy Scale: Fair                               Pertinent Vitals/Pain Pain Assessment Pain Assessment: Faces Faces Pain Scale: Hurts even more Pain Location: R leg Pain Descriptors / Indicators: Tender, Grimacing, Guarding Pain Intervention(s): Monitored during session    Home Living Family/patient expects to be discharged to:: Private residence Living Arrangements: Spouse/significant other Available Help at Discharge: Family Type of Home: House Home Access: Ramped entrance       Home Layout: One level Home Equipment: Shower seat;BSC/3in1;Wheelchair - manual      Prior Function Prior Level of Function : Independent/Modified Independent;Driving;Working/employed  Mobility Comments: works at [employer] Hand: Left    Extremity/Trunk Assessment   Upper Extremity Assessment Upper Extremity Assessment: Defer to OT evaluation    Lower Extremity  Assessment Lower Extremity Assessment: Generalized weakness    Cervical / Trunk Assessment Cervical / Trunk Assessment: Normal  Communication   Communication: No difficulties  Cognition Arousal/Alertness: Awake/alert Behavior During Therapy: WFL for tasks assessed/performed Overall Cognitive Status: Within Functional Limits for tasks assessed                                          General Comments General comments (skin integrity, edema, etc.): spO2 drop to 87% on 2L O2 following ambulation but able to increase to 91-92% after 2 minute seated rest break    Exercises     Assessment/Plan    PT Assessment Patient needs continued PT services  PT Problem List Decreased strength;Decreased activity tolerance;Decreased range of motion;Decreased balance;Decreased mobility;Decreased knowledge of use of DME;Decreased knowledge of precautions;Pain       PT Treatment Interventions DME instruction;Gait training;Functional mobility training;Therapeutic activities;Balance training;Therapeutic exercise;Patient/family education    PT Goals (Current goals can be found in the Care Plan section)  Acute Rehab PT Goals Patient Stated Goal: to go home PT Goal Formulation: With patient/family Time For Goal Achievement: 06/03/22 Potential to Achieve Goals: Good    Frequency Min 5X/week     Co-evaluation               AM-PAC PT "6 Clicks" Mobility  Outcome Measure Help needed turning from your back to your side while in a flat bed without using bedrails?: A Little Help needed moving from lying on your back to sitting on the side of a flat bed without using bedrails?: A Little Help needed moving to and from a bed to a chair (including a wheelchair)?: A Little Help needed standing up from a chair using your arms (e.g., wheelchair or bedside chair)?: A Little Help needed to walk in hospital room?: A Little Help needed climbing 3-5 steps with a railing? : A Lot 6 Click  Score: 17    End of Session Equipment Utilized During Treatment: Oxygen Activity Tolerance: Patient tolerated treatment well Patient left: in chair;with call bell/phone within reach;with family/visitor present Nurse Communication: Mobility status PT Visit Diagnosis: Unsteadiness on feet (R26.81);Muscle weakness (generalized) (M62.81);Difficulty in walking, not elsewhere classified (R26.2);Pain Pain - Right/Left: Right Pain - part of body: Hip    Time: 8003-4917 PT Time Calculation (min) (ACUTE ONLY): 26 min   Charges:   PT Evaluation $PT Eval Moderate Complexity: 1 Mod          Rubee Vega A. Gilford Rile PT, DPT Acute Rehabilitation Services Office [phone]   Linna Hoff 05/20/2022, 12:29 PM

## 2022-05-20 NOTE — Evaluation (Signed)
Occupational Therapy Evaluation Patient Details Name: [name] MRN: [id number] DOB: June 09, 1966 Today's Date: 05/20/2022   History of Present Illness 57 y/o female presented to ED on 05/18/22 after fall on cement. Sustained R valgus impacted femoral neck fx. S/p percutaneous fixation of R femoral neck on 10/13. Also found to be in acute respiratory failure with possible metastatic process on CT chest. No significant PMH.   Clinical Impression   Patient admitted for the diagnosis and procedure above 10/12.  PTA she lives at home with her spouse, works full time and needed no assist with ADL, iADL or mobility.  Pain is the primary deficit.  She is needing generalized supervision for mobility and up to [place] for lower body ADL.  Patient will have assist as needed at home.  No further acute OT needs.        Recommendations for follow up therapy are one component of a multi-disciplinary discharge planning process, led by the attending physician.  Recommendations may be updated based on patient status, additional functional criteria and insurance authorization.   Follow Up Recommendations  Follow physician's recommendations for discharge plan and follow up therapies    Assistance Recommended at Discharge Intermittent Supervision/Assistance  Patient can return home with the following A little help with walking and/or transfers;A little help with bathing/dressing/bathroom;Help with stairs or ramp for entrance;Assist for transportation    Functional Status Assessment  Patient has had a recent decline in their functional status and demonstrates the ability to make significant improvements in function in a reasonable and predictable amount of time.  Equipment Recommendations  None recommended by OT    Recommendations for Other Services       Precautions / Restrictions Precautions Precautions: Fall Restrictions Weight Bearing Restrictions: Yes RLE Weight Bearing: Weight bearing as  tolerated      Mobility Bed Mobility Overal bed mobility: Needs Assistance Bed Mobility: Supine to Sit     Supine to sit: Supervision       Patient Response: Cooperative  Transfers Overall transfer level: Needs assistance Equipment used: Rolling walker (2 wheels) Transfers: Sit to/from Stand, Bed to chair/wheelchair/BSC Sit to Stand: Supervision     Step pivot transfers: Supervision            Balance Overall balance assessment: Needs assistance Sitting-balance support: Feet supported, Bilateral upper extremity supported Sitting balance-Leahy Scale: Good     Standing balance support: Reliant on assistive device for balance Standing balance-Leahy Scale: Fair                             ADL either performed or assessed with clinical judgement   ADL       Grooming: Wash/dry hands;Wash/dry face;Set up;Sitting               Lower Body Dressing: Minimal assistance;Sit to/from stand   Toilet Transfer: Supervision/safety;Rolling walker (2 wheels);Ambulation;Comfort height toilet                   Vision Baseline Vision/History: 1 Wears glasses Patient Visual Report: No change from baseline       Perception Perception Perception: Within Functional Limits   Praxis Praxis Praxis: Intact    Pertinent Vitals/Pain Pain Assessment Pain Assessment: Faces Faces Pain Scale: Hurts even more Pain Location: R leg Pain Descriptors / Indicators: Tender, Grimacing, Guarding Pain Intervention(s): Monitored during session     Hand Dominance Left   Extremity/Trunk Assessment Upper Extremity Assessment Upper  Extremity Assessment: Overall WFL for tasks assessed   Lower Extremity Assessment Lower Extremity Assessment: Defer to PT evaluation   Cervical / Trunk Assessment Cervical / Trunk Assessment: Normal   Communication Communication Communication: No difficulties   Cognition Arousal/Alertness: Awake/alert Behavior During Therapy: WFL  for tasks assessed/performed Overall Cognitive Status: Within Functional Limits for tasks assessed                                       General Comments   VSS on O2    Exercises     Shoulder Instructions      Home Living Family/patient expects to be discharged to:: Private residence Living Arrangements: Spouse/significant other Available Help at Discharge: Family Type of Home: House Home Access: Ramped entrance     Home Layout: One level     Bathroom Shower/Tub: Walk-in shower;Door   [employer] Toilet: Standard Bathroom Accessibility: Yes How Accessible: Accessible via walker Home Equipment: Shower seat;BSC/3in1;Wheelchair - manual          Prior Functioning/Environment Prior Level of Function : Independent/Modified Independent;Driving;Working/employed             Mobility Comments: works at [employer]          OT Problem List: Pain;Impaired balance (sitting and/or standing);Decreased activity tolerance      OT Treatment/Interventions:      OT Goals(Current goals can be found in the care plan section) Acute Rehab OT Goals Patient Stated Goal: Return home OT Goal Formulation: With patient Time For Goal Achievement: 05/24/22 Potential to Achieve Goals: Good  OT Frequency:      Co-evaluation              AM-PAC OT "6 Clicks" Daily Activity     Outcome Measure Help from another person eating meals?: None Help from another person taking care of personal grooming?: None Help from another person toileting, which includes using toliet, bedpan, or urinal?: A Little Help from another person bathing (including washing, rinsing, drying)?: A Little Help from another person to put on and taking off regular upper body clothing?: None Help from another person to put on and taking off regular lower body clothing?: A Little 6 Click Score: 21   End of Session Equipment Utilized During Treatment: Gait belt;Rolling walker (2  wheels);Oxygen Nurse Communication: Mobility status  Activity Tolerance: Patient tolerated treatment well Patient left: in chair;with call bell/phone within reach;with family/visitor present  OT Visit Diagnosis: Unsteadiness on feet (R26.81);Pain Pain - Right/Left: Right Pain - part of body: Leg                Time: 1025-8527 OT Time Calculation (min): 29 min Charges:  OT General Charges $OT Visit: 1 Visit OT Evaluation $OT Eval Moderate Complexity: 1 Mod  05/20/2022  RP, OTR/L  Acute Rehabilitation Services  Office:  [phone]   Suzanna Obey 05/20/2022, 12:12 PM
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
## 2022-05-20 NOTE — Plan of Care (Signed)

## 2022-05-20 NOTE — Progress Notes (Signed)
Orthopaedic Trauma Progress Note  SUBJECTIVE: Doing well. Pain manageable. Has not required narcotic pain meds post-operatively.  No chest pain. No SOB. No nausea/vomiting. No other complaints.   OBJECTIVE:  Vitals:   05/19/22 2131 05/20/22 0433  BP:  106/66  Pulse: 62 63  Resp: 14 16  Temp:  98 F (36.7 C)  SpO2: 96% 99%    General: Sitting up in bedside chair Respiratory: No increased work of breathing.  Right lower extremity: Dressings clean, dry, intact.  Tenderness over the hip as expected.  Tolerates gentle knee range of motion in bed.  Ankle DF/PF intact.  Endorses sensation lower extremity.  Neurovascularly intact  IMAGING: Stable post op imaging.   LABS:  Results for orders placed or performed during the hospital encounter of 05/18/22 (from the past 24 hour(s))  Surgical pcr screen     Status: None   Collection Time: 05/19/22 10:12 AM   Specimen: Nasal Mucosa; Nasal Swab  Result Value Ref Range   MRSA, PCR NEGATIVE NEGATIVE   Staphylococcus aureus NEGATIVE NEGATIVE  CBC     Status: Abnormal   Collection Time: 05/20/22  3:10 AM  Result Value Ref Range   WBC 12.2 (H) 4.0 - 10.5 K/uL   RBC 3.97 3.87 - 5.11 MIL/uL   Hemoglobin 13.1 12.0 - 15.0 g/dL   HCT 42.7 06.2 - 37.6 %   MCV 97.5 80.0 - 100.0 fL   MCH 33.0 26.0 - 34.0 pg   MCHC 33.9 30.0 - 36.0 g/dL   RDW 28.3 15.1 - 76.1 %   Platelets 256 150 - 400 K/uL   nRBC 0.0 0.0 - 0.2 %  Basic metabolic panel     Status: Abnormal   Collection Time: 05/20/22  3:10 AM  Result Value Ref Range   Sodium 138 135 - 145 mmol/L   Potassium 4.2 3.5 - 5.1 mmol/L   Chloride 106 98 - 111 mmol/L   CO2 27 22 - 32 mmol/L   Glucose, Bld 104 (H) 70 - 99 mg/dL   BUN 10 6 - 20 mg/dL   Creatinine, Ser 6.07 0.44 - 1.00 mg/dL   Calcium 8.7 (L) 8.9 - 10.3 mg/dL   GFR, Estimated >37 >10 mL/min   Anion gap 5 5 - 15  Sedimentation rate     Status: None   Collection Time: 05/20/22  3:10 AM  Result Value Ref Range   Sed Rate 10 0 - 22  mm/hr  C-reactive protein     Status: Abnormal   Collection Time: 05/20/22  3:10 AM  Result Value Ref Range   CRP 8.6 (H) <1.0 mg/dL    ASSESSMENT: [name] is a 56 y.o. female, 1 Day Post-Op s/p PERCUTANEOUS FIXATION OF RIGHT FEMORAL NECK  CV/Blood loss: Hemoglobin 13.1 this morning, stable from preop.  Hemodynamically stable  PLAN: Weightbearing: WBAT RLE ROM: Okay for unrestricted hip ROM as tolerated Incisional and dressing care: Reinforce dressings as needed  Showering: Okay to begin showering 05/22/2022 if no drainage from incisions Orthopedic device(s): None  Pain management:  1. Tylenol 325-650 mg q 6 hours PRN 2. Robaxin 500 mg q 6 hours PRN 3. Norco 5-325 mg OR 7.5-325 mg q 4 hours PRN 4. Morphine 0.5 mg q 2 hours PRN VTE prophylaxis: Lovenox, SCDs ID:  Ancef 2gm post op Foley/Lines:  No foley, KVO IVFs Impediments to Fracture Healing: Vitamin D level 6.39, started on supplementation Dispo: PT/OT evaluation today, dispo pending.  Remove dressing tomorrow 05/21/22. Ok for  d/c from ortho standpoint once cleared by therapies and medicine team  D/C recommendations: -Norco and Robaxin for pain control -Aspirin 325 mg daily x30 days for DVT prophylaxis -Continue 50,000 units Vit D supplementation weekly  Follow - up plan: 2 weeks after discharge for wound check and repeat x-rays   Contact information:  Katha Hamming MD, Rushie Nyhan PA-C. After hours and holidays please check [email or web address] for group call information for [facility]   Gwinda Passe, PA-C [phone] (office) [email or web address]
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
## 2022-05-20 NOTE — Progress Notes (Signed)
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 neck fracture, impacted and essentially nondisplaced. Electronically Signed   By: Charline Bills M.D.   On: 05/18/2022 18:43   DG Chest 2 View  Result Date: 05/18/2022 CLINICAL DATA:  Hypoxia. Unable to remove clothing of imaging due to hip pain and fractured right hip. EXAM: CHEST - 2 VIEW COMPARISON:  Chest two views 04/11/2018 FINDINGS: Cardiac silhouette and mediastinal contours are within normal limits. There is flattening of the diaphragms and moderate hyperinflation. There is chronic blunting of the posterior and left costophrenic angles, unchanged from 04/11/2018. This is in the region of inferolateral left rib remote fractures and may represent pleural scarring from that trauma. No focal airspace opacity is seen to indicate pneumonia. No pneumothorax. Mild multilevel degenerative disc changes of the thoracic spine. IMPRESSION: 1. Chronic blunting of the posterior and left costophrenic angles, unchanged from 04/11/2018. This may represent pleural scarring from that trauma. 2. No acute lung process. Electronically Signed   By: Neita Garnet M.D.    On: 05/18/2022 13:09   DG HIP UNILAT WITH PELVIS 2-3 VIEWS RIGHT  Result Date: 05/18/2022 CLINICAL DATA:  Right hip pain, status post fall EXAM: DG HIP (WITH OR WITHOUT PELVIS) 3V RIGHT COMPARISON:  None Available. FINDINGS: Right femoral neck fracture, mildly displaced. No additional fracture is seen. No significant soft tissue abnormality. IMPRESSION: Right femoral neck fracture, mildly displaced. Electronically Signed   By: Wiliam Ke M.D.   On: 05/18/2022 12:56    Scheduled Meds:  docusate sodium  100 mg Oral BID   enoxaparin (LOVENOX) injection  30 mg Subcutaneous Q24H   feeding supplement  237 mL Oral BID BM   fluticasone furoate-vilanterol  1 puff Inhalation Daily   pantoprazole (PROTONIX) IV  40 mg Intravenous QHS   Vitamin D (Ergocalciferol)  50,000 Units Oral Q7 days   Continuous Infusions:  sodium chloride 50 mL/hr at 05/19/22 1438   methocarbamol (ROBAXIN) IV       LOS: 2 days   Hughie Closs, MD [facility]  05/20/2022, 10:46 AM   *Please note that this is a verbal dictation therefore any spelling or grammatical errors are due to the "Dragon Medical One" system interpretation.  Please page via Amion and do not message via secure chat for urgent patient care matters. Secure chat can be used for non urgent patient care matters.  How to contact the [facility] Attending or Consulting provider 7A - 7P or covering provider during after hours 7P -7A, for this patient?  Check the care team in [facility] and look for a) attending/consulting TRH provider listed and b) the [facility] team listed. Page or secure chat 7A-7P. Log into [email or web address] and use [place]'s universal password to access. If you do not have the password, please contact the hospital operator. Locate the [facility] provider you are looking for under [facility] and page to a number that you can be directly reached. If you still have difficulty reaching the provider, please page the [facility] (Director on Call) for the  Hospitalists listed on amion for assistance.

## 2022-05-20 NOTE — Progress Notes (Signed)
Mobility Specialist Progress Note   05/20/22 1459  Mobility  Activity Transferred from bed to chair;Ambulated with assistance in room  Level of Assistance Standby assist, set-up cues, supervision of patient - no hands on  Assistive Device Front wheel walker  Distance Ambulated (ft) 10 ft  Range of Motion/Exercises Active;All extremities  RLE Weight Bearing WBAT   Patient agreeable to participate in mobility. Required cues for hand placement to stand upon dangling EOB. Ambulated short distance in room to recliner chair with slow steady gait. Pain is manageable after receiving tylenol. Tolerated  without complaint or incident. Was left in recliner with all needs met, call bell in reach.   [place] Armandina Iman, [place], [place]  [facility]:[phone] Office: [phone]
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
## 2022-05-21 DIAGNOSIS — R918 Other nonspecific abnormal finding of lung field: Secondary | ICD-10-CM

## 2022-05-21 LAB — RHEUMATOID FACTOR: Rheumatoid fact SerPl-aCnc: 16.1 IU/mL — ABNORMAL HIGH (ref <14.0)

## 2022-05-21 LAB — QUANTIFERON-TB GOLD PLUS: QuantiFERON-TB Gold Plus: NEGATIVE

## 2022-05-21 LAB — QUANTIFERON-TB GOLD PLUS (RQFGPL)
QuantiFERON Mitogen Value: >10 IU/mL
QuantiFERON Nil Value: 0.01 IU/mL
QuantiFERON TB1 Ag Value: 0.01 IU/mL
QuantiFERON TB2 Ag Value: 0.01 IU/mL

## 2022-05-21 LAB — ANA W/REFLEX IF POSITIVE: Anti Nuclear Antibody (ANA): NEGATIVE

## 2022-05-21 LAB — CBC
HCT: 38 % (ref 36.0–46.0)
Hemoglobin: 12.4 g/dL (ref 12.0–15.0)
MCH: 31.7 pg (ref 26.0–34.0)
MCHC: 32.6 g/dL (ref 30.0–36.0)
MCV: 97.2 fL (ref 80.0–100.0)
Platelets: 260 10*3/uL (ref 150–400)
RBC: 3.91 MIL/uL (ref 3.87–5.11)
RDW: 13.6 % (ref 11.5–15.5)
WBC: 9.7 10*3/uL (ref 4.0–10.5)
nRBC: 0 % (ref 0.0–0.2)

## 2022-05-21 LAB — CYCLIC CITRUL PEPTIDE ANTIBODY, IGG/IGA: CCP Antibodies IgG/IgA: 2 units (ref 0–19)

## 2022-05-21 LAB — ALDOLASE: Aldolase: 6.2 U/L (ref 3.3–10.3)

## 2022-05-21 MED ORDER — ACETAMINOPHEN 325 MG PO TABS: 650.0000 mg | ORAL_TABLET | Freq: Four times a day (QID) | ORAL | Status: AC | PRN

## 2022-05-21 MED ORDER — HYDROCODONE-ACETAMINOPHEN 5-325 MG PO TABS: 1.0000 | ORAL_TABLET | Freq: Four times a day (QID) | ORAL | 0 refills | Status: AC | PRN

## 2022-05-21 MED ORDER — VITAMIN D (ERGOCALCIFEROL) 1.25 MG (50000 UNIT) PO CAPS: 50000.0000 [IU] | ORAL_CAPSULE | ORAL | 0 refills | Status: AC

## 2022-05-21 MED ORDER — ASPIRIN 325 MG PO TBEC: 325.0000 mg | DELAYED_RELEASE_TABLET | Freq: Every day | ORAL | 0 refills | Status: AC

## 2022-05-21 MED ORDER — METHOCARBAMOL 500 MG PO TABS: 500.0000 mg | ORAL_TABLET | Freq: Four times a day (QID) | ORAL | 0 refills | Status: AC | PRN

## 2022-05-21 NOTE — Progress Notes (Signed)
Physical Therapy Treatment Patient Details Name: [name] MRN: [id number] DOB: 10/13/1965 Today's Date: 05/21/2022   History of Present Illness 56 y/o female presented to ED on 05/18/22 after fall on cement. Sustained R valgus impacted femoral neck fx. S/p percutaneous fixation of R femoral neck on 10/13. Also found to be in acute respiratory failure with possible metastatic process on CT chest. No significant PMH.    PT Comments    Continuing work on functional mobility and activity tolerance;  session focused on functional transfers and progressive amb in prep for going home today; Getting up to EOB, standing, and walking household distance well with moderate cues to incr step width to avoid scissoring; family present and helpful; discussed car transfers; OK for dc home from PT standpoint   Recommendations for follow up therapy are one component of a multi-disciplinary discharge planning process, led by the attending physician.  Recommendations may be updated based on patient status, additional functional criteria and insurance authorization.  Follow Up Recommendations  Home health PT     Assistance Recommended at Discharge Intermittent Supervision/Assistance  Patient can return home with the following A little help with walking and/or transfers;A little help with bathing/dressing/bathroom   Equipment Recommendations  Rolling walker (2 wheels)    Recommendations for Other Services       Precautions / Restrictions Precautions Precautions: Fall Precaution Comments: Fall risk reduced with use of rW Restrictions RLE Weight Bearing: Weight bearing as tolerated     Mobility  Bed Mobility Overal bed mobility: Needs Assistance Bed Mobility: Supine to Sit     Supine to sit: Supervision     General bed mobility comments: Discussed managing at home, which side of bed to get up on; states she can sleep in the recliner as well    Transfers Overall transfer level: Needs  assistance Equipment used: Rolling walker (2 wheels) Transfers: Sit to/from Stand Sit to Stand: Supervision           General transfer comment: Cuse for hand placemetn    Ambulation/Gait Ambulation/Gait assistance: Min guard, Supervision Gait Distance (Feet): 100 Feet Assistive device: Rolling walker (2 wheels) Gait Pattern/deviations: Step-through pattern, Decreased stride length, Decreased stance time - right, Narrow base of support Gait velocity: decreased     General Gait Details: Narrow step width and cues for stepping wider; Educated pt on incr risk of scissoring and falling; Cues to self-monitor for activity tolerance    Stairs             Wheelchair Mobility    Modified Rankin (Stroke Patients Only)       Balance                                            Cognition Arousal/Alertness: Awake/alert Behavior During Therapy: WFL for tasks assessed/performed Overall Cognitive Status: Within Functional Limits for tasks assessed                                          Exercises      General Comments General comments (skin integrity, edema, etc.): See other PT note of this date; provided pt with therex handout; discussed car transfers      Pertinent Vitals/Pain Pain Assessment Pain Assessment: Faces Faces Pain Scale: Hurts a little bit  Pain Location: R leg Pain Descriptors / Indicators: Grimacing (and occasional breath-holding) Pain Intervention(s): Monitored during session    Home Living                          Prior Function            PT Goals (current goals can now be found in the care plan section) Acute Rehab PT Goals Patient Stated Goal: to go home PT Goal Formulation: With patient/family Time For Goal Achievement: 06/03/22 Potential to Achieve Goals: Good Progress towards PT goals: Progressing toward goals    Frequency    Min 5X/week      PT Plan Current plan remains  appropriate    Co-evaluation              AM-PAC PT "6 Clicks" Mobility   Outcome Measure  Help needed turning from your back to your side while in a flat bed without using bedrails?: A Little Help needed moving from lying on your back to sitting on the side of a flat bed without using bedrails?: A Little Help needed moving to and from a bed to a chair (including a wheelchair)?: A Little Help needed standing up from a chair using your arms (e.g., wheelchair or bedside chair)?: A Little Help needed to walk in hospital room?: A Little Help needed climbing 3-5 steps with a railing? : A Lot 6 Click Score: 17    End of Session Equipment Utilized During Treatment: Gait belt Activity Tolerance: Patient tolerated treatment well Patient left: in chair;with call bell/phone within reach;with family/visitor present Nurse Communication: Mobility status (and ok for dc home) PT Visit Diagnosis: Unsteadiness on feet (R26.81);Muscle weakness (generalized) (M62.81);Difficulty in walking, not elsewhere classified (R26.2);Pain Pain - Right/Left: Right Pain - part of body: Hip     Time: 4709-6283 PT Time Calculation (min) (ACUTE ONLY): 32 min  Charges:  $Gait Training: 8-22 mins $Therapeutic Activity: 8-22 mins                     Roney Marion, PT  Acute Rehabilitation Services Office [place] 05/21/2022, 1:46 PM

## 2022-05-21 NOTE — Progress Notes (Signed)
Orthopaedic Trauma Progress Note  SUBJECTIVE: Doing well. Wants to go home today. No issues orthopaedic wise  OBJECTIVE:  Vitals:   05/20/22 1900 05/21/22 0840  BP: 101/64 108/78  Pulse: 70 (!) 58  Resp: 16 19  Temp:  97.9 F (36.6 C)  SpO2: 91% 92%    General: Laying comfortably in bed, family at bedside Respiratory: No increased work of breathing.  Right lower extremity: incision clean, dry, intact.  TTolerates gentle knee range of motion in bed.  Ankle DF/PF intact.  Endorses sensation lower extremity.  Neurovascularly intact  IMAGING: Stable post op imaging.   LABS:  Results for orders placed or performed during the hospital encounter of 05/18/22 (from the past 24 hour(s))  CBC     Status: None   Collection Time: 05/21/22  4:51 AM  Result Value Ref Range   WBC 9.7 4.0 - 10.5 K/uL   RBC 3.91 3.87 - 5.11 MIL/uL   Hemoglobin 12.4 12.0 - 15.0 g/dL   HCT 38.0 36.0 - 46.0 %   MCV 97.2 80.0 - 100.0 fL   MCH 31.7 26.0 - 34.0 pg   MCHC 32.6 30.0 - 36.0 g/dL   RDW 13.6 11.5 - 15.5 %   Platelets 260 150 - 400 K/uL   nRBC 0.0 0.0 - 0.2 %    ASSESSMENT: ITZABELLA SORRELS is a 56 y.o. female, 2 Days Post-Op s/p PERCUTANEOUS FIXATION OF RIGHT FEMORAL NECK  CV/Blood loss: Hemoglobin 13.1 this morning, stable from preop.  Hemodynamically stable  PLAN: Weightbearing: WBAT RLE ROM: Okay for unrestricted hip ROM as tolerated Incisional and dressing care: Leave open to air Showering: Okay to begin showering 05/22/2022 if no drainage from incisions Orthopedic device(s): None  Pain management:  1. Tylenol 325-650 mg q 6 hours PRN 2. Robaxin 500 mg q 6 hours PRN 3. Norco 5-325 mg OR 7.5-325 mg q 4 hours PRN 4. Morphine 0.5 mg q 2 hours PRN VTE prophylaxis: Lovenox, SCDs ID:  Ancef 2gm post op Foley/Lines:  No foley, KVO IVFs Impediments to Fracture Healing: Vitamin D level 6.39, started on supplementation Dispo: Okay to d/c from ortho standpoint  D/C recommendations: -Norco  and Robaxin for pain control -Aspirin 325 mg daily x30 days for DVT prophylaxis -Continue 50,000 units Vit D supplementation weekly  Follow - up plan: 2 weeks after discharge for wound check and repeat x-rays   Contact information:  Katha Hamming MD, Rushie Nyhan PA-C. After hours and holidays please check [email or web address] for group call information for [facility]  Shona Needles, MD [facility] [phone] (office) [email or web address]
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
## 2022-05-21 NOTE — Discharge Summary (Signed)
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     Microbiology: Recent Results (from the past 240 hour(s))  Resp Panel by RT-PCR (Flu A&B, Covid) Anterior Nasal Swab     Status: None   Collection Time: 05/18/22 12:16 PM   Specimen: Anterior Nasal Swab  Result Value Ref Range Status   SARS Coronavirus 2 by RT PCR NEGATIVE NEGATIVE Final    Comment: (NOTE) SARS-CoV-2 target nucleic acids are NOT DETECTED.  The SARS-CoV-2 RNA is generally detectable in upper respiratory specimens during the acute phase of infection. The lowest concentration of SARS-CoV-2 viral copies this assay can detect is 138 copies/mL. A negative result does not preclude SARS-Cov-2 infection and should not be used as the sole basis for treatment or other patient management decisions. A negative result may occur with  improper specimen collection/handling, submission of specimen other than nasopharyngeal swab, presence of viral mutation(s) within the areas targeted by this assay, and inadequate number of viral copies(<138 copies/mL). A negative result must be combined  with clinical observations, patient history, and epidemiological information. The expected result is Negative.  Fact Sheet for Patients:  07/18/22  Fact Sheet for Healthcare Providers:  BloggerCourse.com  This test is no t yet approved or cleared by the [place] FDA and  has been authorized for detection and/or diagnosis of SARS-CoV-2 by FDA under an Emergency Use Authorization (EUA). This EUA will remain  in effect (meaning this test can be used) for the duration of the COVID-19 declaration under Section 564(b)(1) of the Act, 21 U.S.C.section 360bbb-3(b)(1), unless the authorization is terminated  or revoked sooner.       Influenza A by PCR NEGATIVE NEGATIVE Final   Influenza B by PCR NEGATIVE NEGATIVE Final    Comment: (NOTE) The Xpert Xpress SARS-CoV-2/FLU/RSV plus assay is intended as an aid in the diagnosis of influenza from Nasopharyngeal swab specimens and should not be used as a sole basis for treatment. Nasal washings and aspirates are unacceptable for Xpert Xpress SARS-CoV-2/FLU/RSV testing.  Fact Sheet for Patients: [place]  Fact Sheet for Healthcare Providers: BloggerCourse.com  This test is not yet approved or cleared by the [place] FDA and has been authorized for detection and/or diagnosis of SARS-CoV-2 by FDA under an Emergency Use Authorization (EUA). This EUA will remain in effect (meaning this test can be used) for the duration of the COVID-19 declaration under Section 564(b)(1) of the Act, 21 U.S.C. section 360bbb-3(b)(1), unless the authorization is terminated or revoked.  Performed at [facility], [address] [place], [place]   Surgical pcr screen     Status: None   Collection Time: 05/19/22 10:12 AM   Specimen: Nasal Mucosa; Nasal Swab  Result Value Ref Range Status   MRSA, PCR NEGATIVE NEGATIVE Final    Staphylococcus  aureus NEGATIVE NEGATIVE Final    Comment: (NOTE) The Xpert SA Assay (FDA approved for NASAL specimens in patients 36 years of age and older), is one component of a comprehensive surveillance program. It is not intended to diagnose infection nor to guide or monitor treatment. Performed at [facility], [address] [place], [place] [address]      Labs: BNP (last 3 results) No results for input(s): "BNP" in the last 8760 hours. Basic Metabolic Panel: Recent Labs  Lab 05/18/22 1218 05/19/22 0813 05/19/22 0825 05/20/22 0310  NA 138  --  135 138  K 4.4  --  4.0 4.2  CL 107  --   --  106  CO2 25  --   --  27  GLUCOSE 98  --   --  104*  BUN 13  --   --  10  CREATININE 0.65  --   --  0.65  CALCIUM 9.3  --   --  8.7*  MG  --  1.9  --   --   PHOS  --  3.0  --   --    Liver Function Tests: Recent Labs  Lab 05/19/22 0813  AST 22  ALT 17  ALKPHOS 51  BILITOT 1.3*  PROT 6.7  ALBUMIN 3.6   No results for input(s): "LIPASE", "AMYLASE" in the last 168 hours. No results for input(s): "AMMONIA" in the last 168 hours. CBC: Recent Labs  Lab 05/18/22 1218 05/19/22 0813 05/19/22 0825 05/20/22 0310 05/21/22 0451  WBC 13.1* 13.9*  --  12.2* 9.7  NEUTROABS 9.7* 10.0*  --   --   --   HGB 15.1* 13.4 13.9 13.1 12.4  HCT 45.0 40.9 41.0 38.7 38.0  MCV 97.4 98.6  --  97.5 97.2  PLT 320 242  --  256 260   Cardiac Enzymes: Recent Labs  Lab 05/19/22 0813  CKTOTAL 78   BNP: Invalid input(s): "POCBNP" CBG: No results for input(s): "GLUCAP" in the last 168 hours. D-Dimer No results for input(s): "DDIMER" in the last 72 hours. Hgb A1c No results for input(s): "HGBA1C" in the last 72 hours. Lipid Profile No results for input(s): "CHOL", "HDL", "LDLCALC", "TRIG", "CHOLHDL", "LDLDIRECT" in the last 72 hours. Thyroid function studies Recent Labs    05/19/22 0813  TSH 2.798   Anemia work up No results for input(s): "VITAMINB12", "FOLATE", "FERRITIN",  "TIBC", "IRON", "RETICCTPCT" in the last 72 hours. Urinalysis No results found for: "COLORURINE", "APPEARANCEUR", "LABSPEC", "PHURINE", "GLUCOSEU", "HGBUR", "BILIRUBINUR", "KETONESUR", "PROTEINUR", "UROBILINOGEN", "NITRITE", "LEUKOCYTESUR" Sepsis Labs Recent Labs  Lab 05/18/22 1218 05/19/22 0813 05/20/22 0310 05/21/22 0451  WBC 13.1* 13.9* 12.2* 9.7   Microbiology Recent Results (from the past 240 hour(s))  Resp Panel by RT-PCR (Flu A&B, Covid) Anterior Nasal Swab     Status: None   Collection Time: 05/18/22 12:16 PM   Specimen: Anterior Nasal Swab  Result Value Ref Range Status   SARS Coronavirus 2 by RT PCR NEGATIVE NEGATIVE Final    Comment: (NOTE) SARS-CoV-2 target nucleic acids are NOT DETECTED.  The SARS-CoV-2 RNA is generally detectable in upper respiratory specimens during the acute phase of infection. The lowest concentration of SARS-CoV-2 viral copies this assay can detect is 138 copies/mL. A negative result does not preclude SARS-Cov-2 infection and should not be used as the sole basis for treatment or other patient management decisions. A negative result may occur with  improper specimen collection/handling, submission of specimen other than nasopharyngeal swab, presence of  viral mutation(s) within the areas targeted by this assay, and inadequate number of viral copies(<138 copies/mL). A negative result must be combined with clinical observations, patient history, and epidemiological information. The expected result is Negative.  Fact Sheet for Patients:  https://www.fda.gov/media/152166/download  Fact Sheet for Healthcare Providers:  SeriousBroker.ithttps://www.fda.gov/media/152162/download  This test is no t yet approved or cleared by the [place] FDA andBloggerCourse.com  has been authorized for detection and/or diagnosis of SARS-CoV-2 by FDA under an Emergency Use Authorization (EUA). This EUA will remain  in effect (meaning this test can be used) for the duration of the COVID-19  declaration under Section 564(b)(1) of the Act, 21 U.S.C.section 360bbb-3(b)(1), unless the authorization is terminated  or revoked sooner.       Influenza A by PCR NEGATIVE NEGATIVE Final   Influenza B by PCR NEGATIVE NEGATIVE Final    Comment: (NOTE) The Xpert Xpress SARS-CoV-2/FLU/RSV plus assay is intended as an aid in the diagnosis of influenza from Nasopharyngeal swab specimens and should not be used as a sole basis for treatment. Nasal washings and aspirates are unacceptable for Xpert Xpress SARS-CoV-2/FLU/RSV testing.  Fact Sheet for Patients: BloggerCourse.comhttps://www.fda.gov/media/152166/download  Fact Sheet for Healthcare Providers: SeriousBroker.ithttps://www.fda.gov/media/152162/download  This test is not yet approved or cleared by the [place] FDA and has been authorized for detection and/or diagnosis of SARS-CoV-2 by FDA under an Emergency Use Authorization (EUA). This EUA will remain in effect (meaning this test can be used) for the duration of the COVID-19 declaration under Section 564(b)(1) of the Act, 21 U.S.C. section 360bbb-3(b)(1), unless the authorization is terminated or revoked.  Performed at [facility], [address] [place], [place] [address]   Surgical pcr screen     Status: None   Collection Time: 05/19/22 10:12 AM   Specimen: Nasal Mucosa; Nasal Swab  Result Value Ref Range Status   MRSA, PCR NEGATIVE NEGATIVE Final   Staphylococcus aureus NEGATIVE NEGATIVE Final    Comment: (NOTE) The Xpert SA Assay (FDA approved for NASAL specimens in patients 58 years of age and older), is one component of a comprehensive surveillance program. It is not intended to diagnose infection nor to guide or monitor treatment. Performed at [facility], [address] [place], [place] [address]      Time coordinating discharge: Over 30 minutes  SIGNED:   Hughie Clossavi Madaline Lefeber, MD  [facility] 05/21/2022, 12:53 PM *Please note that this is a verbal dictation  therefore any spelling or grammatical errors are due to the "Dragon Medical One" system interpretation. If 7PM-7AM, please contact night-coverage [email or web address]

## 2022-05-21 NOTE — Progress Notes (Signed)
Physical Treatment Note  (Full treatment note to follow)  SATURATION QUALIFICATIONS: (This note is used to comply with regulatory documentation for home oxygen)  Patient Saturations on Room Air at Rest = 93%  Patient Saturations on Room Air while Ambulating = 89%  Did not require supplemental O2 to keep oxygen saturations above 88%  Roney Marion, [place] Office [phone]

## 2022-05-21 NOTE — TOC Progression Note (Signed)
Transition of Care [facility]) - Progression Note    Patient Details  Name: KAFI DOTTER MRN: [id number] Date of Birth: 03/01/1966  Transition of [facility]) CM/SW Contact  Bartholomew Crews, RN Phone Number: [phone] 05/21/2022, 9:23 AM  Clinical Narrative:     HH PT/Face to Face orders and DME RW orders faxed to [facility]] at [phone] and [phone]. LVM for Erika at [phone]  to advise of faxed orders. TOC following for transition needs.   Expected Discharge Plan: [place] Barriers to Discharge: Continued Medical Work up  Expected Discharge Plan and Services Expected Discharge Plan: [place] In-house Referral: NA Discharge Planning Services: CM Consult Post Acute Care Choice: Home Health, Durable Medical Equipment Living arrangements for the past 2 months: Single Family Home                 DME Arranged: [place] rolling DME Agency: [place] [facility]       HH Arranged: PT           Social Determinants of Health (SDOH) Interventions    Readmission Risk Interventions     No data to display

## 2022-05-22 LAB — ALLERGEN PROFILE, BASIC
Codfish IgE: <0.1 kU/L
Egg White IgE: <0.1 kU/L
Milk IgE: <0.1 kU/L
Peanut IgE: <0.1 kU/L
Soybean IgE: <0.1 kU/L
Wheat IgE: <0.1 kU/L

## 2022-05-22 LAB — ANCA PROFILE
Anti-MPO Antibodies: <0.2 units (ref 0.0–0.9)
Anti-PR3 Antibodies: <0.2 units (ref 0.0–0.9)
Atypical P-ANCA titer: <1:20 {titer}
C-ANCA: <1:20 {titer}
P-ANCA: <1:20 {titer}

## 2022-05-22 LAB — IGE: IgE (Immunoglobulin E), Serum: 24 IU/mL (ref 6–495)

## 2022-05-23 LAB — RESPIRATORY PANEL BY PCR

## 2022-05-24 LAB — HYPERSENSITIVITY PNEUMONITIS
A. Pullulans Abs: NEGATIVE
A.Fumigatus #1 Abs: NEGATIVE
Micropolyspora faeni, IgG: NEGATIVE
Pigeon Serum Abs: NEGATIVE
Thermoact. Saccharii: NEGATIVE
Thermoactinomyces vulgaris, IgG: NEGATIVE

## 2022-05-29 ENCOUNTER — Encounter (HOSPITAL_COMMUNITY): Payer: Self-pay | Admitting: Student

## 2022-05-30 ENCOUNTER — Encounter (HOSPITAL_COMMUNITY): Payer: Self-pay | Admitting: Student

## 2022-06-07 ENCOUNTER — Ambulatory Visit (INDEPENDENT_AMBULATORY_CARE_PROVIDER_SITE_OTHER): Payer: Managed Care, Other (non HMO) | Admitting: Pulmonary Disease

## 2022-06-07 ENCOUNTER — Encounter: Payer: Self-pay | Admitting: Pulmonary Disease

## 2022-06-07 VITALS — BP 110/72 | HR 59 | Ht 65.0 in | Wt 99.4 lb

## 2022-06-07 DIAGNOSIS — R918 Other nonspecific abnormal finding of lung field: Secondary | ICD-10-CM | POA: Diagnosis not present

## 2022-06-07 DIAGNOSIS — Z8709 Personal history of other diseases of the respiratory system: Secondary | ICD-10-CM

## 2022-06-07 MED ORDER — FLUTICASONE FUROATE-VILANTEROL 100-25 MCG/ACT IN AEPB: 1.0000 | INHALATION_SPRAY | Freq: Every day | RESPIRATORY_TRACT | 3 refills | Status: AC

## 2022-06-07 NOTE — Patient Instructions (Addendum)
Thank you for visiting Dr. Valeta Harms at [facility]. Today we recommend the following:  Orders Placed This Encounter  Procedures   CT Super D Chest Wo Contrast   NM PET Image Initial (PI) Skull Base To Thigh (F-18 FDG)   Meds ordered this encounter  Medications   fluticasone furoate-vilanterol (BREO ELLIPTA) 100-25 MCG/ACT AEPB    Sig: Inhale 1 puff into the lungs daily.    Dispense:  30 each    Refill:  3   Return in about 3 weeks (around 06/28/2022) for with Eric Form, NP, or Dr. Valeta Harms.    Please do your part to reduce the spread of COVID-19.
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
## 2022-06-22 ENCOUNTER — Ambulatory Visit (HOSPITAL_COMMUNITY): Payer: PRIVATE HEALTH INSURANCE

## 2022-06-22 ENCOUNTER — Other Ambulatory Visit (HOSPITAL_COMMUNITY): Payer: PRIVATE HEALTH INSURANCE

## 2022-07-07 ENCOUNTER — Ambulatory Visit: Payer: PRIVATE HEALTH INSURANCE | Admitting: Acute Care
